# Patient Record
Sex: Female | Born: 1965 | Hispanic: No | Marital: Married | Smoking: Former smoker
Health system: Southern US, Community
[De-identification: ages and names within clinical notes are randomized; demographics above are authoritative.]

## PROBLEM LIST (undated history)

## (undated) DIAGNOSIS — E039 Hypothyroidism, unspecified: Secondary | ICD-10-CM

## (undated) DIAGNOSIS — E559 Vitamin D deficiency, unspecified: Secondary | ICD-10-CM

## (undated) DIAGNOSIS — K5790 Diverticulosis of intestine, part unspecified, without perforation or abscess without bleeding: Secondary | ICD-10-CM

## (undated) DIAGNOSIS — L409 Psoriasis, unspecified: Secondary | ICD-10-CM

## (undated) DIAGNOSIS — M1990 Unspecified osteoarthritis, unspecified site: Secondary | ICD-10-CM

## (undated) DIAGNOSIS — K8020 Calculus of gallbladder without cholecystitis without obstruction: Secondary | ICD-10-CM

## (undated) DIAGNOSIS — T7840XA Allergy, unspecified, initial encounter: Secondary | ICD-10-CM

## (undated) HISTORY — DX: Calculus of gallbladder without cholecystitis without obstruction: K80.20

## (undated) HISTORY — DX: Diverticulosis of intestine, part unspecified, without perforation or abscess without bleeding: K57.90

## (undated) HISTORY — DX: Allergy, unspecified, initial encounter: T78.40XA

## (undated) HISTORY — DX: Vitamin D deficiency, unspecified: E55.9

## (undated) HISTORY — DX: Unspecified osteoarthritis, unspecified site: M19.90

## (undated) HISTORY — DX: Psoriasis, unspecified: L40.9

## (undated) HISTORY — PX: TONSILLECTOMY: SUR1361

## (undated) HISTORY — DX: Hypothyroidism, unspecified: E03.9

## (undated) HISTORY — PX: TONSILECTOMY, ADENOIDECTOMY, BILATERAL MYRINGOTOMY AND TUBES: SHX2538

---

## 2011-12-13 ENCOUNTER — Ambulatory Visit (INDEPENDENT_AMBULATORY_CARE_PROVIDER_SITE_OTHER): Payer: Managed Care, Other (non HMO) | Admitting: Family Medicine

## 2011-12-13 ENCOUNTER — Encounter: Payer: Self-pay | Admitting: Family Medicine

## 2011-12-13 VITALS — BP 126/82 | HR 81 | Temp 98.3°F | Resp 16 | Ht 66.5 in | Wt 133.8 lb

## 2011-12-13 DIAGNOSIS — L409 Psoriasis, unspecified: Secondary | ICD-10-CM | POA: Insufficient documentation

## 2011-12-13 DIAGNOSIS — E039 Hypothyroidism, unspecified: Secondary | ICD-10-CM | POA: Insufficient documentation

## 2011-12-13 DIAGNOSIS — R1013 Epigastric pain: Secondary | ICD-10-CM

## 2011-12-13 DIAGNOSIS — K3189 Other diseases of stomach and duodenum: Secondary | ICD-10-CM

## 2011-12-13 NOTE — Progress Notes (Signed)
  Subjective:    Patient ID: Tasha House, female    DOB: Dec 11, 1965, 46 y.o.   MRN: [id number]  HPI This 46 y.o. Female is having heartburn and variable stools for several months. She tried Prilosec  for 2 weeks as recommended by ENT- Dr. Lazarus Salines. She continues to have Gi symptoms including  heartburn and abnormal stools,usually loose and malodorous. Pt denies hematochezia or melena.    Review of Systems  Constitutional: Positive for appetite change.  HENT: Positive for congestion, sore throat, trouble swallowing, postnasal drip and sinus pressure.   Eyes: Negative.   Respiratory: Positive for choking.   Cardiovascular: Negative.   Gastrointestinal:       Heartburn and reflux  Genitourinary: Negative.   Musculoskeletal: Negative.   Neurological: Negative.        Objective:   Physical Exam  Vitals reviewed. Constitutional: She is oriented to person, place, and time. She appears well-developed and well-nourished. No distress.  HENT:  Head: Normocephalic and atraumatic.  Right Ear: External ear normal.  Left Ear: External ear normal.  Mouth/Throat: No oropharyngeal exudate.       Mild erythema at posterior pharynx  Eyes: Conjunctivae and EOM are normal. No scleral icterus.  Neck: Normal range of motion. No thyromegaly present.  Cardiovascular: Normal rate and regular rhythm.   Pulmonary/Chest: Effort normal and breath sounds normal. No respiratory distress.  Abdominal: There is tenderness.       Epigastric tenderness with moderate palpation.  Lymphadenopathy:    She has no cervical adenopathy.  Neurological: She is alert and oriented to person, place, and time. No cranial nerve deficit.  Skin: Skin is warm.          Assessment & Plan:   1. Hypothyroid  Continue current medication  2. Psoriasis    3. Dyspepsia  Helicobacter pylori abs-IgG+IgA, bld

## 2011-12-13 NOTE — Patient Instructions (Signed)
Helicobacter Pylori and Ulcer Disease An ulcer may be in your stomach (gastric ulcer) or in the first part of your small bowel, which is called the duodenum (duodenal ulcer). An ulcer is a break in the stomach or duodenum lining. The break wears down into the deeper tissue. Helicobacter pylori (H. pylori) is a type of germ (bacteria) that may cause the majority of gastric or duodenal ulcers. CAUSES   A germ (bacterium). H. pylori can weaken the protective mucous coating of the stomach and duodenum. This allows acid to get through to the sensitive lining of the stomach or duodenum and an ulcer can then form.   Certain medications.   Using substances that can bother the lining of the stomach (alcohol, tobacco or medications such as Advil or Motrin) in the presence of H.pylori infection. This can increase the chances of getting an ulcer.   Cancer (rarely).  Most people infected with H. pylori do not get ulcers. It is not known how people catch H. pylori. It may be through food or water. H. pylori has been found in the saliva of some infected people. Therefore, the bacteria may also spread through mouth-to-mouth contact such as kissing. SYMPTOMS  The problems (symptoms) of ulcer disease are usually:  A burning or gnawing of the mid-upper belly (abdomen). This is often worse on an empty stomach. It may get better with food. This may be associated with feeling sick to your stomach (nausea), bloating and vomiting.   If the ulcer results in bleeding, it can cause:   Black, tarry stools.   Throwing up bright red blood.   Throwing up coffee ground looking materials.  With severe bleeding, there may be loss of consciousness and shock. Besides ulcer disease, H. pylori can also cause chronic gastritis (irritation of the lining of the stomach without ulcer) or stomach acid-type discomfort. You may not have symptoms even though you have an H. pylori infection. Although this is an infection, you may not  have usual infection symptoms (such as fever). DIAGNOSIS  Ulcer disease can be diagnosed in many different ways. If you have an ulcer, it is important to know whether or not it is caused by H. Pylori. Treatment for an ulcer caused by H. pylori is different from that for an ulcer with other causes. The best way to detect H. pylori is taking tissue directly from the ulcer during an endoscopy test.   An endoscopy is an exam that uses an endoscope. This is a thin, lighted tube with a small camera on the end. It is like a flexible telescope. The patient is given a drug to make them calm (sedative). The caregiver eases the endoscope into the mouth and down the throat to the stomach and duodenum. This allows the doctor to see the lining of the esophagus, stomach and duodenum.   If an endoscopy is not needed, then H. pylori can be detected with tests of the blood, stool or even breath.  TREATMENT   H. pylori peptic ulcer treatment usually involves a combination of:   Medicines that kill germs (antibiotics).   Acid suppressors.   Stomach protectors.   The use of only one medication to treat H. pylori is not recommended. The most proven treatment is a 2 week course of treatment called triple therapy. It involves taking two antibiotics to kill the bacteria and either an acid suppressor or stomach-lining shield. Two-week triple therapy reduces ulcer symptoms, kills the bacteria, and prevents ulcers from coming back in many   patients.   Unfortunately, patients may find triple therapy hard to do. This is because it involves taking as many as 20 pills a day. Also, the antibiotics used in triple therapy may cause mild side effects. These include nausea, vomiting, diarrhea, dark stools, a metallic taste in the mouth, dizziness, headache and yeast infections in women. Talk to your caregiver if you have any of these side effects.  HOME CARE INSTRUCTIONS   Take your medications as directed and for as long as  prescribed. Contact your caregiver if you have problems or side effects from your medications.   Continue regular work and usual activities unless told otherwise by your caregiver.   Avoid tobacco, alcohol and caffeine. Tobacco use will decrease and slow healing.   Avoid medications that are harmful. This includes aspirin and NSAIDS such as ibuprofen and naproxen.   Avoid foods that seem to aggravate or cause discomfort.   There are many over-the-counter products available to control stomach acid and other symptoms. Discuss these with your caregiver before using them. Do not  stop taking prescription medications for over-the-counter medications without talking with your caregiver.   Special diets are not usually needed.   Keep any follow-up appointments and blood tests as directed.  SEEK MEDICAL CARE IF:   Your pain or other ulcer symptoms do not improve within a few days of starting treatment.   You develop diarrhea. This can be a problem related to certain treatments.   You have ongoing indigestion or heartburn even if your main ulcer symptoms are improved.   You think you have any side effects from your medications or if you do not understand how to use your medications right.  SEEK IMMEDIATE MEDICAL CARE IF:  Any of the following happen:  You develop bright red, rectal bleeding.   You develop dark black, tarry stools.   You throw up (vomit) blood.   You become light-headed, weak, have fainting episodes, or become sweaty, cold and clammy.   You have severe abdominal pain not controlled by medications. Do not take pain medications unless ordered by your caregiver.  MAKE SURE YOU:   Understand these instructions.   Will watch your condition.   Will get help right away if you are not doing well or get worse.  Document Released: 11/25/2003 Document Revised: 08/24/2011 Document Reviewed: 04/23/2008 [employer] Patient Information 2012 [employer]. 

## 2011-12-21 ENCOUNTER — Encounter: Payer: Self-pay | Admitting: Family Medicine

## 2011-12-21 DIAGNOSIS — R1013 Epigastric pain: Secondary | ICD-10-CM | POA: Insufficient documentation

## 2011-12-26 MED ORDER — OMEPRAZOLE 20 MG PO CPDR: DELAYED_RELEASE_CAPSULE | ORAL | Status: DC

## 2011-12-26 MED ORDER — BIS SUBCIT-METRONID-TETRACYC 140-125-125 MG PO CAPS: 3.0000 | ORAL_CAPSULE | Freq: Three times a day (TID) | ORAL | Status: DC

## 2011-12-26 NOTE — Progress Notes (Signed)
The pt has a significant H. Pylori infection; Pylera is prescribed because she has a PCN allergy. The Pylera medication should be taken 4x daily and Omeprazole is 2x daily for 10 days then she will continue Omeprazole daily for 10 -12 weeks.

## 2011-12-26 NOTE — Progress Notes (Signed)
Quick Note:  Please call pt and advise that the following labs are abnormal... Blood test for H. Pylori antibody is very high and is a positive result.  Following medications have been e-prescribed: Pylera (antibiotics to get rid of H. Pylori) and Omeprazole to reduce acid.  Pt needs to get started on these and take per directions. If she is unclear, please consult pharmacist at the time she picks up medication.  Provide copy of labs to pt. ______

## 2011-12-26 NOTE — Progress Notes (Signed)
Addended by: Dow Adolph B on: 12/26/2011 06:02 PM   Modules accepted: Orders

## 2011-12-27 ENCOUNTER — Telehealth: Payer: Self-pay

## 2011-12-27 NOTE — Telephone Encounter (Signed)
Pt is calling back in regards to questions about medication prescribed on 12/13/11. She is about to begin taking it an dis unsure of instructions

## 2011-12-27 NOTE — Telephone Encounter (Signed)
Patient would like a call back about the rx that Dr. Audria Nine prescribed.

## 2011-12-27 NOTE — Telephone Encounter (Signed)
LMOM TO CB 

## 2011-12-28 NOTE — Telephone Encounter (Signed)
Pt returning clinical phone call please contact her @ [phone]

## 2011-12-31 NOTE — Telephone Encounter (Signed)
LMOM TO CB 

## 2012-01-03 NOTE — Telephone Encounter (Signed)
LMOM TO CB 

## 2012-01-04 NOTE — Telephone Encounter (Signed)
Pt CB and stated that she has a couple of days of her Abx left but has just recently in last 1-2 days started having more Sxs again. Reports that her heartburn is worse and she has a poor appetite and has to go immediately to the bathroom for BM right after eating, but it is not diarrhea. She is taking omeprazole 40 BID that she already had - she did not p/up your Rx since she already had some. Pt is wondering if these could be SEs of the Abx this late in the course, or if she needs something else instead of omeprazole? Please advise. Pt asks for CB before 2 pm at home number so that we can reach her.

## 2012-01-05 NOTE — Telephone Encounter (Signed)
Gave pt message from Dr Audria Nine and explained probiotics to pt and D/W ?s about labs. Mailed copy of labs

## 2012-01-05 NOTE — Telephone Encounter (Signed)
It probably is the antibiotics (alot of medication to take 4x a day!); Continue Omeprazole and try Activia yogurt 1-2 x a day.

## 2012-01-23 ENCOUNTER — Other Ambulatory Visit: Payer: Self-pay | Admitting: Gastroenterology

## 2012-01-25 ENCOUNTER — Ambulatory Visit
Admission: RE | Admit: 2012-01-25 | Discharge: 2012-01-25 | Disposition: A | Discharge status: Home / Self Care | Payer: Managed Care, Other (non HMO) | Source: Ambulatory Visit | Attending: Gastroenterology | Admitting: Gastroenterology

## 2012-01-25 MED ORDER — IOHEXOL 300 MG/ML  SOLN
100.0000 mL | Freq: Once | INTRAMUSCULAR | Status: AC | PRN
  Administered 2012-01-25: 100 mL via INTRAVENOUS

## 2012-09-23 ENCOUNTER — Encounter: Payer: Self-pay | Admitting: Critical Care Medicine

## 2012-09-24 ENCOUNTER — Ambulatory Visit (INDEPENDENT_AMBULATORY_CARE_PROVIDER_SITE_OTHER): Payer: Managed Care, Other (non HMO) | Admitting: Critical Care Medicine

## 2012-09-24 ENCOUNTER — Encounter: Payer: Self-pay | Admitting: Critical Care Medicine

## 2012-09-24 VITALS — BP 116/74 | HR 76 | Temp 97.7°F | Ht 68.0 in | Wt 144.2 lb

## 2012-09-24 DIAGNOSIS — R911 Solitary pulmonary nodule: Secondary | ICD-10-CM | POA: Insufficient documentation

## 2012-09-24 NOTE — Patient Instructions (Signed)
A CT Chest will be obtained , I will call results

## 2012-09-24 NOTE — Progress Notes (Signed)
Subjective:    Patient ID: Tasha House, female    DOB: 09-Mar-1966, 47 y.o.   MRN: [id number]  HPI 47 y.o. F referred for abnormal CT Scan Lung nodule on abd CT Pt had stool change and loss of appt.  Bacteria in stomach. H pylori. Then had GI eval ?CA  Hx of pancreatic CA  In father.  Dr Dulce Sellar did colonoscopy and then Ct abd done with nodule done. No real dyspnea or cough.  No weight loss.  Appt is better now.  No chest pains. Ex smoker since 2010.  Smoked 52yrs at peak 10cigs per day in high school, since children 2-3 per day.  No pneumonia hx.   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 No TB exposure.  PPD neg in high school. In 2004, sl positive PPD.  CXR unremarkable. No farming exposure. Grew up in [place].    Past Medical History  Diagnosis Date  . Allergy   . Hypothyroid   . Psoriasis   . Arthritis      Family History  Problem Relation Age of Onset  . Colon cancer Father   . Liver cancer Father   . Pancreatic cancer Father   . Glaucoma Mother   . Asthma Paternal Grandfather      History   Social History  . Marital Status: Unknown    Spouse Name: N/A    Number of Children: 2  . Years of Education: N/A   Occupational History  . Medical Secretary    Social History Main Topics  . Smoking status: Former Smoker -- 0.5 packs/day for 10 years    Types: Cigarettes    Quit date: 12/12/2008  . Smokeless tobacco: Not on file  . Alcohol Use: Yes     Comment: occ   . Drug Use: No  . Sexually Active: Not on file   Other Topics Concern  . Not on file   Social History Narrative  . No narrative on file     Allergies  Allergen Reactions  . Penicillins      Outpatient Prescriptions Prior to Visit  Medication Sig Dispense Refill  . sodium chloride (OCEAN) 0.65 % nasal spray Place 1 spray into the nose as needed.      . [DISCONTINUED] bismuth-metronidazole-tetracycline (PYLERA) 140-125-125 MG per capsule Take 3  capsules by mouth 4 (four) times daily -  before meals and at bedtime.  120 capsule  0  . [DISCONTINUED] fluticasone (FLONASE) 50 MCG/ACT nasal spray Place 2 sprays into the nose daily.      . [DISCONTINUED] levothyroxine (SYNTHROID, LEVOTHROID) 100 MCG tablet Take 100 mcg by mouth daily.      . [DISCONTINUED] naproxen sodium (ANAPROX) 220 MG tablet Take 220 mg by mouth 2 (two) times daily with a meal.      . [DISCONTINUED] omeprazole (PRILOSEC) 20 MG capsule Take 1 capsule twice a day with breakfast and dinner for 10 days then continue taking 1 capsule daily every AM.  60 capsule  1   Last reviewed on 09/24/2012 10:55 AM by Storm Frisk, MD    Review of Systems  Constitutional: Negative for fever, chills, diaphoresis, activity change, appetite change, fatigue and unexpected weight change.  HENT: Positive for rhinorrhea, postnasal drip and sinus pressure. Negative for hearing loss, ear pain, nosebleeds, congestion, sore throat, facial swelling, sneezing, mouth sores, trouble swallowing, neck pain, neck stiffness, dental problem, voice change, tinnitus and ear discharge.   Eyes:  Positive for itching. Negative for photophobia, discharge and visual disturbance.  Respiratory: Negative for apnea, cough, choking, chest tightness, shortness of breath, wheezing and stridor.   Cardiovascular: Negative for chest pain, palpitations and leg swelling.  Gastrointestinal: Negative for nausea, vomiting, abdominal pain, constipation, blood in stool and abdominal distention.  Genitourinary: Negative for dysuria, urgency, frequency, hematuria, flank pain, decreased urine volume and difficulty urinating.  Musculoskeletal: Positive for arthralgias. Negative for myalgias, back pain, joint swelling and gait problem.  Skin: Negative for color change, pallor and rash.  Neurological: Negative for dizziness, tremors, seizures, syncope, speech difficulty, weakness, light-headedness, numbness and headaches.    Hematological: Negative for adenopathy. Does not bruise/bleed easily.  Psychiatric/Behavioral: Negative for confusion, sleep disturbance and agitation. The patient is not nervous/anxious.        Objective:   Physical Exam Filed Vitals:   09/24/12 1040  BP: 116/74  Pulse: 76  Temp: 97.7 F (36.5 C)  TempSrc: Oral  Height: 5\' 8"  (1.727 m)  Weight: 65.409 kg (144 lb 3.2 oz)  SpO2: 100%    Gen: Pleasant, well-nourished, in no distress,  normal affect  ENT: No lesions,  mouth clear,  oropharynx clear, no postnasal drip  Neck: No JVD, no TMG, no carotid bruits  Lungs: No use of accessory muscles, no dullness to percussion, clear without rales or rhonchi  Cardiovascular: RRR, heart sounds normal, no murmur or gallops, no peripheral edema  Abdomen: soft and NT, no HSM,  BS normal  Musculoskeletal: No deformities, no cyanosis or clubbing  Neuro: alert, non focal  Skin: Warm, no lesions or rashes  CT Abdomen 01/2012 reviewed: 4.57mm RLL nodule      Assessment & Plan:

## 2012-09-24 NOTE — Assessment & Plan Note (Signed)
RLL nodule seen on abd CT 01/2012.  Ex smoker. 4.28mm in diameter. Doubt is meaningful Plan Repeat CT of Chest . If unchanged, reimage in 12 months and if unchanged, no further CTs

## 2012-09-27 ENCOUNTER — Inpatient Hospital Stay: Admission: RE | Admit: 2012-09-27 | Payer: Managed Care, Other (non HMO) | Source: Ambulatory Visit

## 2012-09-30 ENCOUNTER — Ambulatory Visit (INDEPENDENT_AMBULATORY_CARE_PROVIDER_SITE_OTHER)
Admission: RE | Admit: 2012-09-30 | Discharge: 2012-09-30 | Disposition: A | Discharge status: Home / Self Care | Payer: Managed Care, Other (non HMO) | Source: Ambulatory Visit | Attending: Critical Care Medicine | Admitting: Critical Care Medicine

## 2012-09-30 DIAGNOSIS — R911 Solitary pulmonary nodule: Secondary | ICD-10-CM

## 2012-11-21 ENCOUNTER — Ambulatory Visit (INDEPENDENT_AMBULATORY_CARE_PROVIDER_SITE_OTHER): Payer: Managed Care, Other (non HMO) | Admitting: Emergency Medicine

## 2012-11-21 VITALS — BP 118/80 | HR 76 | Temp 98.0°F | Resp 16 | Ht 68.0 in | Wt 143.0 lb

## 2012-11-21 DIAGNOSIS — J309 Allergic rhinitis, unspecified: Secondary | ICD-10-CM

## 2012-11-21 MED ORDER — FLUTICASONE PROPIONATE 50 MCG/ACT NA SUSP: 2.0000 | Freq: Every day | NASAL | Status: DC

## 2012-11-21 NOTE — Progress Notes (Signed)
  Subjective:    Patient ID: Tasha House, female    DOB: [birth year], 47 y.o.   MRN: [id number]  HPI pt presents with nasal pain, congestion and body aches that started a week ago. Today she has cough and itchy throat. Two years ago she saw an ENT, Dr. Lazarus Salines. She uses Flonase nasal spray each night.   She also has a Scientist, research (life sciences). Next month.   Review of Systems patient also asking about an itching she is having in her skin. She is asking whether this could be related to liver or kidney disease. I offered her the possibility of having her blood work done to check these issues and she would prefer to talk with this to her dermatologist and gynecologist prior to having blood work done to     Objective:   Physical Exam patient is alert and cooperative and not in distress. Her neck is supple. Examination of the nasal cavity reveals the turbinates to be blue and swollen. TMs are clear. The posterior pharynx is normal there are no swollen glands in the neck. Her chest is clear to both auscultation and percussion.        Assessment & Plan:  You may use Zyrtec or Claritin to help. Try it for two or three days to see if it helps. Continue Flonase return to clinic to see me for blood work after she has visited her dermatologist and gynecologist.

## 2013-03-10 ENCOUNTER — Ambulatory Visit (INDEPENDENT_AMBULATORY_CARE_PROVIDER_SITE_OTHER): Payer: Managed Care, Other (non HMO) | Admitting: Family Medicine

## 2013-03-10 VITALS — BP 107/69 | HR 70 | Temp 98.0°F | Resp 17 | Ht 68.0 in | Wt 144.0 lb

## 2013-03-10 DIAGNOSIS — L4050 Arthropathic psoriasis, unspecified: Secondary | ICD-10-CM

## 2013-03-10 DIAGNOSIS — M461 Sacroiliitis, not elsewhere classified: Secondary | ICD-10-CM

## 2013-03-10 MED ORDER — CYCLOBENZAPRINE HCL 10 MG PO TABS: 10.0000 mg | ORAL_TABLET | Freq: Two times a day (BID) | ORAL | Status: DC | PRN

## 2013-03-10 MED ORDER — METHYLPREDNISOLONE 4 MG PO TABS: ORAL_TABLET | ORAL | Status: DC

## 2013-03-10 NOTE — Patient Instructions (Signed)
Psoriasis Psoriasis is a common, long-lasting (chronic) inflammation of the skin. It affects both men and women equally, of all ages and all races. Psoriasis cannot be passed from person to person (not contagious). Psoriasis varies from mild to very severe. When severe, it can greatly affect your quality of life. Psoriasis is an inflammatory disorder affecting the skin as well as other organs including the joints (causing an arthritis). With psoriasis, the skin sheds its top layer of cells more rapidly than it does in someone without psoriasis. CAUSES  The cause of psoriasis is largely unknown. Genetics, your immune system, and the environment seem to play a role in causing psoriasis. Factors that can make psoriasis worse include:  Damage or trauma to the skin, such as cuts, scrapes, and sunburn. This damage often causes new areas of psoriasis (lesions).  Winter dryness and lack of sunlight.  Medicines such as lithium, beta-blockers, antimalarial drugs, ACE inhibitors, nonsteroidal anti-inflammatory drugs (ibuprofen, aspirin), and terbinafine. Let your caregiver know if you are taking any of these drugs.  Alcohol. Excessive alcohol use should be avoided if you have psoriasis. Drinking large amounts of alcohol can affect:  How well your psoriasis treatment works.  How safe your psoriasis treatment is.  Smoking. If you smoke, ask your caregiver for help to quit.  Stress.  Bacterial or viral infections.  Arthritis. Arthritis associated with psoriasis (psoriatic arthritis) affects less than 10% of patients with psoriasis. The arthritic intensity does not always match the skin psoriasis intensity. It is important to let your caregiver know if your joints hurt or if they are stiff. SYMPTOMS  The most common form of psoriasis begins with little red bumps that gradually become larger. The bumps begin to form scales that flake off easily. The lower layers of scales stick together. When these scales  are scratched or removed, the underlying skin is tender and bleeds easily. These areas then grow in size and may become large. Psoriasis often creates a rash that looks the same on both sides of the body (symmetrical). It often affects the elbows, knees, groin, genitals, arms, legs, scalp, and nails. Affected nails often have pitting, loosen, thicken, crumble, and are difficult to treat.  "Inverse psoriasis"occurs in the armpits, under breasts, in skin folds, and around the groin, buttocks, and genitals.  "Guttate psoriasis" generally occurs in children and young adults following a recent sore throat (strep throat). It begins with many small, red, scaly spots on the skin. It clears spontaneously in weeks or a few months without treatment. DIAGNOSIS  Psoriasis is diagnosed by physical exam. A tissue sample (biopsy) may also be taken. TREATMENT The treatment of psoriasis depends on your age, health, and living conditions.  Steroid (cortisone) creams, lotions, and ointments may be used. These treatments are associated with thinning of the skin, blood vessels that get larger (dilated), loss of skin pigmentation, and easy bruising. It is important to use these steroids as directed by your caregiver. Only treat the affected areas and not the normal, unaffected skin. People on long-term steroid treatment should wear a medical alert bracelet. Injections may be used in areas that are difficult to treat.  Scalp treatments are available as shampoos, solutions, sprays, foams, and oils. Avoid scratching the scalp and picking at the scales.  Anthralin medicine works well on areas that are difficult to treat. However, it stains clothes and skin and may cause temporary irritation.  Synthetic vitamin D (calcipotriene)can be used on small areas. It is available by prescription. The forms   of synthetic vitamin D available in health food stores do not help with psoriasis.  Coal tarsare available in various strengths  for psoriasis that is difficult to treat. They are one of the longest used treatments for difficult to treat psoriasis. However, they are messy to use.  Light therapy (UV therapy) can be carefully and professionally monitored in a dermatologist's office. Careful sunbathing is helpful for many people as directed by your caregiver. The exposure should be just long enough to cause a mild redness (erythema) of your skin. Avoid sunburn as this may make the condition worse. Sunscreen (SPF of 30 or higher) should be used to protect against sunburn. Cataracts, wrinkles, and skin aging are some of the harmful side effects of light therapy.  If creams (topical medicines) fail, there are several other options for systemic or oral medicines your caregiver can suggest. Psoriasis can sometimes be very difficult to treat. It can come and go. It is necessary to follow up with your caregiver regularly if your psoriasis is difficult to treat. Usually, with persistence you can get a good amount of relief. Maintaining consistent care is important. Do not change caregivers just because you do not see immediate results. It may take several trials to find the right combination of treatment for you. PREVENTING FLARE-UPS  Wear gloves while you wash dishes, while cleaning, and when you are outside in the cold.  If you have radiators, place a bowl of water or damp towel on the radiator. This will help put water back in the air. You can also use a humidifier to keep the air moist. Try to keep the humidity at about 60% in your home.  Apply moisturizer while your skin is still damp from bathing or showering. This traps water in the skin.  Avoid long, hot baths or showers. Keep soap use to a minimum. Soaps dry out the skin and wash away the protective oils. Use a fragrance free, dye free soap.  Drink enough water and fluids to keep your urine clear or pale yellow. Not drinking enough water depletes your skin's water  supply.  Turn off the heat at night and keep it low during the day. Cool air is less drying. SEEK MEDICAL CARE IF:  You have increasing pain in the affected areas.  You have uncontrolled bleeding in the affected areas.  You have increasing redness or warmth in the affected areas.  You start to have pain or stiffness in your joints.  You start feeling depressed about your condition.  You have a fever. Document Released: 09/01/2000 Document Revised: 11/27/2011 Document Reviewed: 02/27/2011 [facility] Patient Information 2014 [place], [place]. Sacroiliac Joint Dysfunction The sacroiliac joint connects the lower part of the spine (the sacrum) with the bones of the pelvis. CAUSES  Sometimes, there is no obvious reason for sacroiliac joint dysfunction. Other times, it may occur   During pregnancy.  After injury, such as:  Car accidents.  Sport-related injuries.  Work-related injuries.  Due to one leg being shorter than the other.  Due to other conditions that affect the joints, such as:  Rheumatoid arthritis.  Gout.  Psoriasis.  Joint infection (septic arthritis). SYMPTOMS  Symptoms may include:  Pain in the:  Lower back.  Buttocks.  Groin.  Thighs and legs.  Difficult sitting, standing, walking, lying, bending or lifting. DIAGNOSIS  A number of tests may be used to help diagnose the cause of sacroiliac joint dysfunction, including:  Imaging tests to look for other causes of pain, including:  MRI.  CT scan.  Bone scan.  Diagnostic injection: During a special x-ray (called fluoroscopy), a needle is put into the sacroiliac joint. A numbing medicine is injected into the joint. If the pain is improved or stopped, the diagnosis of sacroiliac joint dysfunction is more likely. TREATMENT  There are a number of types of treatment used for sacroiliac joint dysfunction, including:  Only take over-the-counter or prescription medicines for pain, discomfort, or fever  as directed by your caregiver.  Medications to relax muscles.  Rest. Decreasing activity can help cut down on painful muscle spasms and allow the back to heal.  Application of heat or ice to the lower back may improve muscle spasms and soothe pain.  Brace. A special back brace, called a sacroiliac belt, can help support the joint while your back is healing.  Physical therapy can help teach comfortable positions and exercises to strengthen muscles that support the sacroiliac joint.  Cortisone injections. Injections of steroid medicine into the joint can help decrease swelling and improve pain.  Hyaluronic acid injections. This chemical improves lubrication within the sacroiliac joint, thereby decreasing pain.  Radiofrequency ablation. A special needle is placed into the joint, where it burns away nerves that are carrying pain messages from the joint.  Surgery. Because pain occurs during movement of the joint, screws and plates may be installed in order to limit or prevent joint motion. HOME CARE INSTRUCTIONS   Take all medications exactly as directed.  Follow instructions regarding both rest and physical activity, to avoid worsening the pain.  Do physical therapy exercises exactly as prescribed. SEEK IMMEDIATE MEDICAL CARE IF:  You experience increasingly severe pain.  You develop new symptoms, such as numbness or tingling in your legs or feet.  You lose bladder or bowel control. Document Released: 12/01/2008 Document Revised: 11/27/2011 Document Reviewed: 12/01/2008 [facility] Patient Information 2014 [place], [place].

## 2013-03-10 NOTE — Progress Notes (Signed)
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 She's not been doing as much work in the last 2 weeks and is noted spontaneous increase in right sacroiliac area during this last week. She has psoriasis as well and notes a psoriatic rash on her anterior surfaces of her elbows and shins.  She's had this sacroiliac pain in the past and is taking muscle relaxer for it.    objective: Patient is in no acute distress Straight-leg raising is negative Palpation of the sacroiliac joint is mildly tender Is no overlying swelling or erythema in the sacroiliac region of the right side. Knee crossover is quite painful on the right but negative on the left.  Assessment: Sacroiliac pain probably secondary to psoriatic arthritis  Psoriatic arthritis - Plan: methylPREDNISolone (MEDROL) 4 MG tablet  Sacroiliac inflammation - Plan: cyclobenzaprine (FLEXERIL) 10 MG tablet  Signed, Elvina Sidle, MD

## 2013-03-16 ENCOUNTER — Telehealth: Payer: Self-pay

## 2013-03-16 NOTE — Telephone Encounter (Signed)
Pt states the medicine prescribed at last ov is not working, she is still in pain as if she had not taken anything Please call pt to advised

## 2013-03-17 ENCOUNTER — Other Ambulatory Visit: Payer: Self-pay | Admitting: Family Medicine

## 2013-03-17 DIAGNOSIS — M549 Dorsalgia, unspecified: Secondary | ICD-10-CM

## 2013-03-17 NOTE — Telephone Encounter (Signed)
I will refer to orthopedics.

## 2013-03-17 NOTE — Telephone Encounter (Signed)
Left message for her advise.

## 2013-03-17 NOTE — Telephone Encounter (Signed)
Assessment: Sacroiliac pain probably secondary to psoriatic arthritis  Psoriatic arthritis - Plan: methylPREDNISolone (MEDROL) 4 MG tablet  Sacroiliac inflammation - Plan: cyclobenzaprine (FLEXERIL) 10 MG tablet  Please advise. States no relief with the above treatment.

## 2015-11-16 ENCOUNTER — Ambulatory Visit: Payer: Managed Care, Other (non HMO) | Admitting: Podiatry

## 2015-11-30 ENCOUNTER — Ambulatory Visit: Payer: Self-pay

## 2015-11-30 ENCOUNTER — Encounter: Payer: Self-pay | Admitting: Podiatry

## 2015-11-30 ENCOUNTER — Ambulatory Visit (INDEPENDENT_AMBULATORY_CARE_PROVIDER_SITE_OTHER): Payer: Managed Care, Other (non HMO) | Admitting: Podiatry

## 2015-11-30 VITALS — BP 136/85 | HR 60 | Resp 12

## 2015-11-30 DIAGNOSIS — M2042 Other hammer toe(s) (acquired), left foot: Secondary | ICD-10-CM | POA: Diagnosis not present

## 2015-11-30 NOTE — Progress Notes (Signed)
   Subjective:    Patient ID: Tasha CairoJasminka House, female    DOB: September 17, 1966, 50 y.o.   MRN: [id number]  HPI: She presents today with a chief complaint of a hammertoe deformity second digit of the left foot. She states it has become more prevalent over the past few months. Starting to rub her shoe and causing pain with ambulation. She was referred to us by Dr. Thurston HoleWainer from orthopedics.  Review of Systems  All other systems reviewed and are negative.      Objective:   Physical Exam: I have reviewed her past medical history medications allergy surgery social history and review of systems. She presents in no acute distress. Vital signs stable alert and oriented 3. Pulses are palpable. Neurologic sensorium is intact per Semmes-Weinstein monofilament. Deep tendon reflexes are intact. Muscle strength is 5 over 5 dorsiflexion plantar flexors and inverters everters all intrinsic musculature is intact. Orthopedic evaluation demonstrates mild flexible hammertoe deformity lesser digits with exception of the second digit left foot which does appear to be contracted rigidly at the PIPJ second digit left foot. There is a soft corn overlying the PIPJ with mild erythema. It is nontender on palpation. No other open wounds or lesions are noted. Radiographs confirm hammertoe deformity second left.        Assessment & Plan:  Assessment: Hammertoe deformity second left.  Plan: Discussed etiology pathology conservative versus surgical therapies. We consented her today for hammertoe repair of the PIPJ second digit left foot. I answered all the questions to the best of my ability in layman's terms. She understood it was amenable to it and signed all 3 patient the consent form. We did discuss the possible postop complications which may include but are not limited to postop pain bleeding swelling infection loss of digit loss of limb loss of life. We dispensed a postop shoe and I will follow-up with her in the near future for  surgery.

## 2015-11-30 NOTE — Patient Instructions (Signed)
Pre-Operative Instructions  Congratulations, you have decided to take an important step to improving your quality of life.  You can be assured that the doctors of [facility] will be with you every step of the way.  1. Plan to be at the surgery center/hospital at least 1 (one) hour prior to your scheduled time unless otherwise directed by the surgical center/hospital staff.  You must have a responsible adult accompany you, remain during the surgery and drive you home.  Make sure you have directions to the surgical center/hospital and know how to get there on time. 2. For hospital based surgery you will need to obtain a history and physical form from your family physician within 1 month prior to the date of surgery- we will give you a form for you primary physician.  3. We make every effort to accommodate the date you request for surgery.  There are however, times where surgery dates or times have to be moved.  We will contact you as soon as possible if a change in schedule is required.   4. No Aspirin/Ibuprofen for one week before surgery.  If you are on aspirin, any non-steroidal anti-inflammatory medications (Mobic, Aleve, Ibuprofen) you should stop taking it 7 days prior to your surgery.  You make take Tylenol  For pain prior to surgery.  5. Medications- If you are taking daily heart and blood pressure medications, seizure, reflux, allergy, asthma, anxiety, pain or diabetes medications, make sure the surgery center/hospital is aware before the day of surgery so they may notify you which medications to take or avoid the day of surgery. 6. No food or drink after midnight the night before surgery unless directed otherwise by surgical center/hospital staff. 7. No alcoholic beverages 24 hours prior to surgery.  No smoking 24 hours prior to or 24 hours after surgery. 8. Wear loose pants or shorts- loose enough to fit over bandages, boots, and casts. 9. No slip on shoes, sneakers are best. 10. Bring  your boot with you to the surgery center/hospital.  Also bring crutches or a walker if your physician has prescribed it for you.  If you do not have this equipment, it will be provided for you after surgery. 11. If you have not been contracted by the surgery center/hospital by the day before your surgery, call to confirm the date and time of your surgery. 12. Leave-time from work may vary depending on the type of surgery you have.  Appropriate arrangements should be made prior to surgery with your employer. 13. Prescriptions will be provided immediately following surgery by your doctor.  Have these filled as soon as possible after surgery and take the medication as directed. 14. Remove nail polish on the operative foot. 15. Wash the night before surgery.  The night before surgery wash the foot and leg well with the antibacterial soap provided and water paying special attention to beneath the toenails and in between the toes.  Rinse thoroughly with water and dry well with a towel.  Perform this wash unless told not to do so by your physician.  Enclosed: 1 Ice pack (please put in freezer the night before surgery)   1 Hibiclens skin cleaner   Pre-op Instructions  If you have any questions regarding the instructions, do not hesitate to call our office.  [place]: [address] [place], [place] [address] [phone]  [place]: [address] [place], [place] [address] [phone]  [place]: [address]  [place], [place] [address] [phone]  Dr. Richard   Tuchman DPM, Dr. Norman Regal DPM Dr. Richard Sikora DPM, Dr. M. Todd Hyatt DPM, Dr. Kathryn Egerton DPM 

## 2017-02-22 LAB — HM DEXA SCAN: HM Dexa Scan: NORMAL

## 2017-10-15 LAB — HM COLONOSCOPY

## 2018-02-18 ENCOUNTER — Emergency Department (HOSPITAL_BASED_OUTPATIENT_CLINIC_OR_DEPARTMENT_OTHER): Payer: Commercial Managed Care - PPO

## 2018-02-18 ENCOUNTER — Emergency Department (HOSPITAL_BASED_OUTPATIENT_CLINIC_OR_DEPARTMENT_OTHER)
Admission: EM | Admit: 2018-02-18 | Discharge: 2018-02-18 | Disposition: A | Discharge status: Home / Self Care | Payer: Commercial Managed Care - PPO | Attending: Emergency Medicine | Admitting: Emergency Medicine

## 2018-02-18 ENCOUNTER — Encounter (HOSPITAL_BASED_OUTPATIENT_CLINIC_OR_DEPARTMENT_OTHER): Payer: Self-pay | Admitting: *Deleted

## 2018-02-18 ENCOUNTER — Other Ambulatory Visit: Payer: Self-pay

## 2018-02-18 DIAGNOSIS — K5900 Constipation, unspecified: Secondary | ICD-10-CM | POA: Insufficient documentation

## 2018-02-18 DIAGNOSIS — R1013 Epigastric pain: Secondary | ICD-10-CM | POA: Insufficient documentation

## 2018-02-18 DIAGNOSIS — E039 Hypothyroidism, unspecified: Secondary | ICD-10-CM | POA: Insufficient documentation

## 2018-02-18 DIAGNOSIS — R1011 Right upper quadrant pain: Secondary | ICD-10-CM | POA: Diagnosis not present

## 2018-02-18 DIAGNOSIS — R112 Nausea with vomiting, unspecified: Secondary | ICD-10-CM | POA: Diagnosis not present

## 2018-02-18 DIAGNOSIS — Z87891 Personal history of nicotine dependence: Secondary | ICD-10-CM | POA: Insufficient documentation

## 2018-02-18 LAB — URINALYSIS, ROUTINE W REFLEX MICROSCOPIC
BILIRUBIN URINE: NEGATIVE
Glucose, UA: NEGATIVE mg/dL
KETONES UR: 15 mg/dL — AB
Leukocytes, UA: NEGATIVE
NITRITE: NEGATIVE
Protein, ur: NEGATIVE mg/dL
Specific Gravity, Urine: 1.02 (ref 1.005–1.030)
pH: 7 (ref 5.0–8.0)

## 2018-02-18 LAB — CBC WITH DIFFERENTIAL/PLATELET
BASOS PCT: 0 %
Basophils Absolute: 0 10*3/uL (ref 0.0–0.1)
EOS ABS: 0 10*3/uL (ref 0.0–0.7)
Eosinophils Relative: 1 %
HEMATOCRIT: 39 % (ref 36.0–46.0)
Hemoglobin: 13.6 g/dL (ref 12.0–15.0)
LYMPHS ABS: 1.3 10*3/uL (ref 0.7–4.0)
Lymphocytes Relative: 15 %
MCH: 30.9 pg (ref 26.0–34.0)
MCHC: 34.9 g/dL (ref 30.0–36.0)
MCV: 88.6 fL (ref 78.0–100.0)
MONO ABS: 0.6 10*3/uL (ref 0.1–1.0)
MONOS PCT: 7 %
NEUTROS ABS: 6.6 10*3/uL (ref 1.7–7.7)
Neutrophils Relative %: 77 %
Platelets: 203 10*3/uL (ref 150–400)
RBC: 4.4 MIL/uL (ref 3.87–5.11)
RDW: 12.6 % (ref 11.5–15.5)
WBC: 8.6 10*3/uL (ref 4.0–10.5)

## 2018-02-18 LAB — COMPREHENSIVE METABOLIC PANEL
ALBUMIN: 4 g/dL (ref 3.5–5.0)
ALK PHOS: 84 U/L (ref 38–126)
ALT: 24 U/L (ref 14–54)
AST: 26 U/L (ref 15–41)
Anion gap: 9 (ref 5–15)
BUN: 10 mg/dL (ref 6–20)
CALCIUM: 9.1 mg/dL (ref 8.9–10.3)
CO2: 25 mmol/L (ref 22–32)
Chloride: 101 mmol/L (ref 101–111)
Creatinine, Ser: 0.63 mg/dL (ref 0.44–1.00)
GFR calc Af Amer: >60 mL/min (ref >60)
GFR calc non Af Amer: >60 mL/min (ref >60)
GLUCOSE: 114 mg/dL — AB (ref 65–99)
Potassium: 3.8 mmol/L (ref 3.5–5.1)
Sodium: 135 mmol/L (ref 135–145)
Total Bilirubin: 0.7 mg/dL (ref 0.3–1.2)
Total Protein: 7.2 g/dL (ref 6.5–8.1)

## 2018-02-18 LAB — LIPASE, BLOOD: Lipase: 27 U/L (ref 11–51)

## 2018-02-18 LAB — URINALYSIS, MICROSCOPIC (REFLEX)

## 2018-02-18 LAB — PREGNANCY, URINE: Preg Test, Ur: NEGATIVE

## 2018-02-18 MED ORDER — ONDANSETRON 4 MG PO TBDP: 4.0000 mg | ORAL_TABLET | Freq: Three times a day (TID) | ORAL | 0 refills | Status: DC | PRN

## 2018-02-18 MED ORDER — SODIUM CHLORIDE 0.9 % IV BOLUS
500.0000 mL | Freq: Once | INTRAVENOUS | Status: AC
  Administered 2018-02-18: 500 mL via INTRAVENOUS

## 2018-02-18 MED ORDER — ONDANSETRON 4 MG PO TBDP
4.0000 mg | ORAL_TABLET | Freq: Once | ORAL | Status: AC
  Administered 2018-02-18: 4 mg via ORAL
  Filled 2018-02-18: qty 1

## 2018-02-18 MED ORDER — TRAMADOL HCL 50 MG PO TABS: 50.0000 mg | ORAL_TABLET | Freq: Four times a day (QID) | ORAL | 0 refills | Status: AC | PRN

## 2018-02-18 MED ORDER — GI COCKTAIL ~~LOC~~
30.0000 mL | Freq: Once | ORAL | Status: AC
  Administered 2018-02-18: 30 mL via ORAL
  Filled 2018-02-18: qty 30

## 2018-02-18 MED ORDER — IOPAMIDOL (ISOVUE-300) INJECTION 61%
100.0000 mL | Freq: Once | INTRAVENOUS | Status: AC | PRN
  Administered 2018-02-18: 100 mL via INTRAVENOUS

## 2018-02-18 MED ORDER — DICYCLOMINE HCL 20 MG PO TABS: 20.0000 mg | ORAL_TABLET | Freq: Two times a day (BID) | ORAL | 0 refills | Status: DC

## 2018-02-18 MED ORDER — KETOROLAC TROMETHAMINE 30 MG/ML IJ SOLN
30.0000 mg | Freq: Once | INTRAMUSCULAR | Status: AC
  Administered 2018-02-18: 30 mg via INTRAVENOUS
  Filled 2018-02-18: qty 1

## 2018-02-18 NOTE — ED Provider Notes (Signed)
Emergency Department Provider Note   I have reviewed the triage vital signs and the nursing notes.   HISTORY  Chief Complaint Abdominal Pain   HPI Tasha House is a 52 y.o. female with PMH of psoriasis presents to the ED for evaluation of abdominal pain.  The patient states that she woke up with primarily mid epigastric and periumbilical abdominal pain.  She thought that she might be constipated to drink coffee to try and induce a bowel movement but began vomiting.  She has had burning, epigastric pain with some associated discomfort in the periumbilical region all day.  Symptoms are worsening throughout the day.  She has had additional nonbloody emesis.  She has had a bowel movement and passed some flatus with no significant improvement in symptoms.  She denies prior history of similar pain.  Her only surgical history is 2 cesarean sections without complication.  She denies any fevers or chills.  No associated diarrhea. No radiation of symptoms or modifying factors.   Past Medical History:  Diagnosis Date  . Allergy   . Arthritis   . Hypothyroid   . Psoriasis     Patient Active Problem List   Diagnosis Date Noted  . Lung nodule seen on imaging study 09/24/2012  . Dyspepsia 12/21/2011  . Hypothyroid 12/13/2011  . Psoriasis 12/13/2011    Past Surgical History:  Procedure Laterality Date  . CESAREAN SECTION    . TONSILECTOMY, ADENOIDECTOMY, BILATERAL MYRINGOTOMY AND TUBES      Current Outpatient Rx  . Order #: [id number] Class: Historical Med  . Order #: [id number] Class: Normal  . Order #: [id number] Class: Normal  . Order #: [id number] Class: Normal  . Order #: [id number] Class: Historical Med  . Order #: [id number] Class: Print  . Order #: [id number] Class: Historical Med  . Order #: [id number] Class: Historical Med  . Order #: [id number] Class: Print  . Order #: [id number] Class: Print    Allergies Penicillins  Family History  Problem Relation Age of Onset  . Colon cancer Father    . Liver cancer Father   . Pancreatic cancer Father   . Glaucoma Mother   . Asthma Paternal Grandfather     Social History Social History   Tobacco Use  . Smoking status: Former Smoker    Packs/day: 0.00    Years: 0.00    Pack years: 0.00    Last attempt to quit: 12/12/2008    Years since quitting: 9.1  Substance Use Topics  . Alcohol use: Yes    Comment: occ   . Drug use: No    Review of Systems  Constitutional: No fever/chills Eyes: No visual changes. ENT: No sore throat. Cardiovascular: Denies chest pain. Respiratory: Denies shortness of breath. Gastrointestinal: Positive epigastric and periumbilical abdominal pain. Positive nausea and vomiting.  No diarrhea. Positive mild constipation. Genitourinary: Negative for dysuria. Musculoskeletal: Negative for back pain. Skin: Negative for rash. Neurological: Negative for headaches, focal weakness or numbness.  10-point ROS otherwise negative.  ____________________________________________   PHYSICAL EXAM:  VITAL SIGNS: ED Triage Vitals  Enc Vitals Group     BP 02/18/18 1631 135/78     Pulse Rate 02/18/18 1631 68     Resp 02/18/18 1631 18     Temp 02/18/18 1631 98.3 F (36.8 C)     Temp Source 02/18/18 1631 Oral     SpO2 02/18/18 1631 100 %     Weight 02/18/18 1636 166 lb 0.1 oz (75.3 kg)     Height 02/18/18 1636 5\' 6"  (1.676 m)  Pain Score 02/18/18 1636 8   Constitutional: Alert and oriented. Well appearing and in no acute distress. Eyes: Conjunctivae are normal. Head: Atraumatic. Nose: No congestion/rhinnorhea. Mouth/Throat: Mucous membranes are moist.   Neck: No stridor.   Cardiovascular: Normal rate, regular rhythm. Good peripheral circulation. Grossly normal heart sounds.   Respiratory: Normal respiratory effort.  No retractions. Lungs CTAB. Gastrointestinal: Soft with diffuse mild tenderness without guarding or rebound. No distention.  Musculoskeletal: No lower extremity tenderness nor edema. No  gross deformities of extremities. Neurologic:  Normal speech and language. No gross focal neurologic deficits are appreciated.  Skin:  Skin is warm, dry and intact. No rash noted.  ____________________________________________   LABS (all labs ordered are listed, but only abnormal results are displayed)  Labs Reviewed  COMPREHENSIVE METABOLIC PANEL - Abnormal; Notable for the following components:      Result Value   Glucose, Bld 114 (*)    All other components within normal limits  URINALYSIS, ROUTINE W REFLEX MICROSCOPIC - Abnormal; Notable for the following components:   Hgb urine dipstick SMALL (*)    Ketones, ur 15 (*)    All other components within normal limits  URINALYSIS, MICROSCOPIC (REFLEX) - Abnormal; Notable for the following components:   Bacteria, UA FEW (*)    All other components within normal limits  LIPASE, BLOOD  CBC WITH DIFFERENTIAL/PLATELET  PREGNANCY, URINE   ____________________________________________  RADIOLOGY  Ct Abdomen Pelvis W Contrast  Result Date: 02/18/2018 CLINICAL DATA:  Acute abdominal pain EXAM: CT ABDOMEN AND PELVIS WITH CONTRAST TECHNIQUE: Multidetector CT imaging of the abdomen and pelvis was performed using the standard protocol following bolus administration of intravenous contrast. CONTRAST:  ISOVUE-300 IOPAMIDOL (ISOVUE-300) INJECTION 61% COMPARISON:  CT 01/25/2012 FINDINGS: Lower chest: Lung bases are clear. Hepatobiliary: No focal hepatic lesion. No intrahepatic biliary duct dilatation. There is a large elongated calculus which extends into the neck of the gallbladder measuring up to 24 mm. (Images 37-43 of coronal series 5). There is mild gallbladder wall thickening to 4 mm. Gallbladder is mildly distended 4 cm. Common bile duct is normal caliber. Pancreas: Pancreas is normal. No ductal dilatation. No pancreatic inflammation. Spleen: Normal spleen Adrenals/urinary tract: Adrenal glands and kidneys are normal. The ureters and bladder  normal. Stomach/Bowel: Stomach, small bowel, appendix, and cecum are normal. The colon and rectosigmoid colon are normal. Vascular/Lymphatic: Abdominal aorta is normal caliber. No periportal or retroperitoneal adenopathy. No pelvic adenopathy. Reproductive: Uterus and ovaries normal Other: No free fluid. Musculoskeletal: No aggressive osseous lesion. IMPRESSION: 1. Elongated calculus which tapers into the neck of the gallbladder coupled with mild gallbladder wall thickening is concerning for a chronic or early acute cholecystitis. Consider nuclear medicine HIDA scan for further evaluation if clinical presentation suggests acute cholecystitis. 2. Common bile duct normal caliber. No intrahepatic duct dilatation. 3. Gallbladder and pancreas normal. Electronically Signed   By: Genevive Bi M.D.   On: 02/18/2018 21:02    ____________________________________________   PROCEDURES  Procedure(s) performed:   Procedures  None ____________________________________________   INITIAL IMPRESSION / ASSESSMENT AND PLAN / ED COURSE  Pertinent labs & imaging results that were available during my care of the patient were reviewed by me and considered in my medical decision making (see chart for details).  Patient presents to the emergency department for evaluation of abdominal pain which is burning in quality with some associated discomfort in the periumbilical abdominal region.  She has diffuse mild tenderness.  Labs from triage reviewed with no acute findings.  Very low suspicion for acute appendicitis or gallbladder disease.  Patient has no focal right upper quadrant tenderness.  Plan for GI cocktail, ODT Zofran, and reassess.   07:18 PM Patient is not feeling any better after GI cocktail and Zofran.  Abdominal exam is unchanged.  Given her tenderness plan for CT imaging of the abdomen and pelvis to rule out early appendicitis with periumbilical pain.  Also considering gastric or duodenal ulcer and with  diffuse tenderness would want to rule out perforation.   CT imaging reviewed and discussed with GI given HIDA recommendation. Patient described feeling much better at this time and is asking to eat. She was given PO in the ED with no worsening pain. Labs are normal. Unable to obtain US at this time but with patient feeling much improved will discharge home with plan for general surgery follow up. Discussed ED return precaution and symptoms of acute cholecystitis in detail with the patient. She will call to schedule the follow up appointment tomorrow.   At this time, I do not feel there is any life-threatening condition present. I have reviewed and discussed all results (EKG, imaging, lab, urine as appropriate), exam findings with patient. I have reviewed nursing notes and appropriate previous records.  I feel the patient is safe to be discharged home without further emergent workup. Discussed usual and customary return precautions. Patient and family (if present) verbalize understanding and are comfortable with this plan.  Patient will follow-up with their primary care provider. If they do not have a primary care provider, information for follow-up has been provided to them. All questions have been answered.  ____________________________________________  FINAL CLINICAL IMPRESSION(S) / ED DIAGNOSES  Final diagnoses:  RUQ abdominal pain     MEDICATIONS GIVEN DURING THIS VISIT:  Medications  gi cocktail (Maalox,Lidocaine,Donnatal) (30 mLs Oral Given 02/18/18 1816)  ondansetron (ZOFRAN-ODT) disintegrating tablet 4 mg (4 mg Oral Given 02/18/18 1816)  ketorolac (TORADOL) 30 MG/ML injection 30 mg (30 mg Intravenous Given 02/18/18 1922)  sodium chloride 0.9 % bolus 500 mL (0 mLs Intravenous Stopped 02/18/18 2005)  iopamidol (ISOVUE-300) 61 % injection 100 mL (100 mLs Intravenous Contrast Given 02/18/18 2040)     NEW OUTPATIENT MEDICATIONS STARTED DURING THIS VISIT:  Discharge Medication List as of 02/18/2018  10:29 PM    START taking these medications   Details  dicyclomine (BENTYL) 20 MG tablet Take 1 tablet (20 mg total) by mouth 2 (two) times daily., Starting Mon 02/18/2018, Print    ondansetron (ZOFRAN ODT) 4 MG disintegrating tablet Take 1 tablet (4 mg total) by mouth every 8 (eight) hours as needed for nausea or vomiting., Starting Mon 02/18/2018, Print    traMADol (ULTRAM) 50 MG tablet Take 1 tablet (50 mg total) by mouth every 6 (six) hours as needed., Starting Mon 02/18/2018, Print        Note:  This document was prepared using Dragon voice recognition software and may include unintentional dictation errors.  Alona Bene, MD Emergency Medicine    Emon Miggins, Arlyss Repress, MD 02/19/18 [id number]

## 2018-02-18 NOTE — ED Notes (Signed)
ED Provider at bedside. 

## 2018-02-18 NOTE — ED Triage Notes (Signed)
Abdominal pain since this morning at 5:30, drank a cup of coffee and vomited.  Patient took Dulcolax thinking that she is constipated.  Burning pain to epigastric area.

## 2018-02-18 NOTE — ED Notes (Signed)
Pt given water for PO challenge 

## 2018-02-18 NOTE — ED Notes (Signed)
Pt given water and crackers for PO challenge

## 2018-02-18 NOTE — Discharge Instructions (Signed)
You have been seen in the Emergency Department (ED) for abdominal pain.  Your evaluation shows some inflamed gallbladder. You should schedule an appointment to see the general surgeon ASAP.  Please follow up as instructed above regarding today?s emergent visit and the symptoms that are bothering you.  Return to the ED if your abdominal pain worsens or fails to improve, you develop bloody vomiting, bloody diarrhea, you are unable to tolerate fluids due to vomiting, fever greater than 101, or other symptoms that concern you.

## 2018-03-19 LAB — HM PAP SMEAR: HM Pap smear: NORMAL

## 2018-03-19 LAB — HM MAMMOGRAPHY

## 2018-05-31 ENCOUNTER — Ambulatory Visit: Payer: Self-pay | Admitting: Family Medicine

## 2018-05-31 ENCOUNTER — Encounter: Payer: Self-pay | Admitting: Family Medicine

## 2018-05-31 ENCOUNTER — Ambulatory Visit (INDEPENDENT_AMBULATORY_CARE_PROVIDER_SITE_OTHER): Payer: Commercial Managed Care - PPO | Admitting: Family Medicine

## 2018-05-31 VITALS — BP 122/80 | HR 76 | Temp 98.7°F | Resp 16 | Ht 67.0 in | Wt 165.6 lb

## 2018-05-31 DIAGNOSIS — E559 Vitamin D deficiency, unspecified: Secondary | ICD-10-CM

## 2018-05-31 DIAGNOSIS — Z111 Encounter for screening for respiratory tuberculosis: Secondary | ICD-10-CM

## 2018-05-31 DIAGNOSIS — Z7689 Persons encountering health services in other specified circumstances: Secondary | ICD-10-CM

## 2018-05-31 DIAGNOSIS — Z780 Asymptomatic menopausal state: Secondary | ICD-10-CM

## 2018-05-31 DIAGNOSIS — L409 Psoriasis, unspecified: Secondary | ICD-10-CM | POA: Diagnosis not present

## 2018-05-31 DIAGNOSIS — K8020 Calculus of gallbladder without cholecystitis without obstruction: Secondary | ICD-10-CM | POA: Insufficient documentation

## 2018-05-31 DIAGNOSIS — E039 Hypothyroidism, unspecified: Secondary | ICD-10-CM

## 2018-05-31 NOTE — Progress Notes (Signed)
   Subjective:    Patient ID: Tasha House, female    DOB: [birth year], 52 y.o.   MRN: [id number]  HPI Chief Complaint  Patient presents with  . new pt    new pt, rash/dry skin on legs- 40 + years, having issues with hip with arthitis, has dermatology in october   She is new to the practice and here with to establish care. She also has complaints of psoriasis flare for the past year.  States she was diagnosed with psoriasis 40 years ago. Has tried numerous medications including topical steroids, UV light, salt therapy etc. Has never been on systemic treatment but states this has been recommended in the past by her rheumatologist and dermatologist.  States she is now ready to start on a medication such as Humira or Otezla but cannot get in to her new dermatologist until late October. Requests labs today.   States she was diagnosed with psoriatic arthritis and has seen rheumatologist for this but last visit was 5 years ago here in town but cannot recall the office.   Denies fever, chills, dizziness, chest pain, palpitations, shortness of breath, abdominal pain, N/V/D, urinary symptoms.    Other providers: OB/GYN- [facility]  [facility]- Dr. Hinda Lenisutlaw  [facility]- Dr. Barnetta ChapelWhelan  Vitamin D def-taking a daily supplement and states vitamin D was normal at last visit with OB/GYN Hypothyroidism- Dr. Senaida OresMcCoomb her OB/GYN is treating this  Colonoscopy- January 2019. Recall in 5 years due to father having colon cancer.   Married with 2 children. Works for [employer]   Reviewed allergies, medications, past medical, surgical, family, and social history.   Review of Systems Pertinent positives and negatives in the history of present illness.     Objective:   Physical Exam BP 122/80   Pulse 76   Temp 98.7 F (37.1 C) (Oral)   Resp 16   Ht 5\' 7"  (1.702 m)   Wt 165 lb 9.6 oz (75.1 kg)   SpO2 98%   BMI 25.94 kg/m   Alert and in no distress. Pharyngeal area is normal. Neck  is supple without adenopathy or thyromegaly. Cardiac exam shows a regular sinus rhythm without murmurs or gallops. Lungs are clear to auscultation. Bilateral LE with large areas of erythematous plaques with defined margins.       Assessment & Plan:  Psoriasis - Plan: CBC with Differential/Platelet, Comprehensive metabolic panel  Vitamin D deficiency  Encounter to establish care  Screening for tuberculosis - Plan: QuantiFERON-TB Gold Plus  Hypothyroidism, unspecified type  Postmenopausal  We will check basic labs and will check QuantiFERON due to history of vaccine in her childhood while [place] up [place].  My assistant called and got her an earlier appointment later this month with dermatology specialist.  Discussed that I do not typically prescribe Biologics that she is requesting. Her OB/GYN is managing her hypothyroidism and vitamin D deficiency. We will follow-up pending labs.

## 2018-06-03 ENCOUNTER — Encounter (HOSPITAL_BASED_OUTPATIENT_CLINIC_OR_DEPARTMENT_OTHER): Payer: Self-pay | Admitting: *Deleted

## 2018-06-06 LAB — COMPREHENSIVE METABOLIC PANEL
ALK PHOS: 81 IU/L (ref 39–117)
ALT: 36 IU/L — ABNORMAL HIGH (ref 0–32)
AST: 35 IU/L (ref 0–40)
Albumin/Globulin Ratio: 1.9 (ref 1.2–2.2)
Albumin: 4.8 g/dL (ref 3.5–5.5)
BILIRUBIN TOTAL: 0.4 mg/dL (ref 0.0–1.2)
BUN/Creatinine Ratio: 12 (ref 9–23)
BUN: 10 mg/dL (ref 6–24)
CHLORIDE: 101 mmol/L (ref 96–106)
CO2: 24 mmol/L (ref 20–29)
CREATININE: 0.81 mg/dL (ref 0.57–1.00)
Calcium: 10 mg/dL (ref 8.7–10.2)
GFR calc Af Amer: 97 mL/min/{1.73_m2} (ref >59)
GFR calc non Af Amer: 84 mL/min/{1.73_m2} (ref >59)
GLUCOSE: 98 mg/dL (ref 65–99)
Globulin, Total: 2.5 g/dL (ref 1.5–4.5)
Potassium: 4.2 mmol/L (ref 3.5–5.2)
Sodium: 143 mmol/L (ref 134–144)
Total Protein: 7.3 g/dL (ref 6.0–8.5)

## 2018-06-06 LAB — CBC WITH DIFFERENTIAL/PLATELET
BASOS ABS: 0 10*3/uL (ref 0.0–0.2)
Basos: 0 %
EOS (ABSOLUTE): 0.2 10*3/uL (ref 0.0–0.4)
Eos: 3 %
Hematocrit: 40.9 % (ref 34.0–46.6)
Hemoglobin: 14 g/dL (ref 11.1–15.9)
Immature Grans (Abs): 0 10*3/uL (ref 0.0–0.1)
Immature Granulocytes: 0 %
LYMPHS ABS: 2 10*3/uL (ref 0.7–3.1)
Lymphs: 40 %
MCH: 30.4 pg (ref 26.6–33.0)
MCHC: 34.2 g/dL (ref 31.5–35.7)
MCV: 89 fL (ref 79–97)
MONOS ABS: 0.6 10*3/uL (ref 0.1–0.9)
Monocytes: 12 %
Neutrophils Absolute: 2.2 10*3/uL (ref 1.4–7.0)
Neutrophils: 45 %
PLATELETS: 221 10*3/uL (ref 150–450)
RBC: 4.6 x10E6/uL (ref 3.77–5.28)
RDW: 12.4 % (ref 12.3–15.4)
WBC: 4.9 10*3/uL (ref 3.4–10.8)

## 2018-06-06 LAB — QUANTIFERON-TB GOLD PLUS
QUANTIFERON NIL VALUE: 0.05 [IU]/mL
QuantiFERON TB1 Ag Value: 0.08 IU/mL
QuantiFERON TB2 Ag Value: 0.07 IU/mL
QuantiFERON-TB Gold Plus: NEGATIVE

## 2018-06-11 ENCOUNTER — Encounter: Payer: Self-pay | Admitting: Family Medicine

## 2018-06-13 ENCOUNTER — Telehealth: Payer: Self-pay | Admitting: Internal Medicine

## 2018-06-13 ENCOUNTER — Telehealth: Payer: Self-pay | Admitting: Family Medicine

## 2018-06-13 NOTE — Telephone Encounter (Signed)
Faxed over results to Dermatology specialist to [phone]

## 2018-06-13 NOTE — Telephone Encounter (Signed)
Received requested records from [facility]. Included are notes, labs and colonoscopy. Sending back for review.

## 2018-06-20 ENCOUNTER — Encounter: Payer: Self-pay | Admitting: Family Medicine

## 2018-06-20 DIAGNOSIS — K5790 Diverticulosis of intestine, part unspecified, without perforation or abscess without bleeding: Secondary | ICD-10-CM | POA: Insufficient documentation

## 2018-07-01 ENCOUNTER — Telehealth: Payer: Self-pay | Admitting: Family Medicine

## 2018-07-01 NOTE — Telephone Encounter (Signed)
Received requested records from [facility]. Sending back for review.

## 2018-07-03 ENCOUNTER — Encounter: Payer: Self-pay | Admitting: Family Medicine

## 2018-07-10 ENCOUNTER — Telehealth: Payer: Self-pay | Admitting: Family Medicine

## 2018-07-10 NOTE — Telephone Encounter (Signed)
Received requested records form [facility]. Include is pap, mammogram, labs and bone density. Sending back for review.

## 2018-07-15 ENCOUNTER — Encounter: Payer: Self-pay | Admitting: Family Medicine

## 2018-07-23 ENCOUNTER — Encounter: Payer: Self-pay | Admitting: Internal Medicine

## 2018-07-24 ENCOUNTER — Encounter: Payer: Self-pay | Admitting: Family Medicine

## 2018-07-29 ENCOUNTER — Ambulatory Visit: Payer: Commercial Managed Care - PPO | Admitting: Family Medicine

## 2018-07-29 ENCOUNTER — Encounter: Payer: Self-pay | Admitting: Family Medicine

## 2018-07-29 VITALS — BP 116/72 | HR 66 | Temp 98.1°F | Wt 162.2 lb

## 2018-07-29 DIAGNOSIS — E039 Hypothyroidism, unspecified: Secondary | ICD-10-CM

## 2018-07-29 DIAGNOSIS — R252 Cramp and spasm: Secondary | ICD-10-CM | POA: Diagnosis not present

## 2018-07-29 DIAGNOSIS — E559 Vitamin D deficiency, unspecified: Secondary | ICD-10-CM | POA: Diagnosis not present

## 2018-07-29 DIAGNOSIS — R740 Nonspecific elevation of levels of transaminase and lactic acid dehydrogenase [LDH]: Secondary | ICD-10-CM

## 2018-07-29 DIAGNOSIS — R7401 Elevation of levels of liver transaminase levels: Secondary | ICD-10-CM

## 2018-07-29 LAB — POCT URINALYSIS DIP (PROADVANTAGE DEVICE)
BILIRUBIN UA: NEGATIVE mg/dL
Bilirubin, UA: NEGATIVE
Glucose, UA: NEGATIVE mg/dL
Leukocytes, UA: NEGATIVE
Nitrite, UA: NEGATIVE
PH UA: 5.5 (ref 5.0–8.0)
PROTEIN UA: NEGATIVE mg/dL
RBC UA: NEGATIVE
SPECIFIC GRAVITY, URINE: 1.025
Urobilinogen, Ur: 3.5

## 2018-07-29 NOTE — Progress Notes (Signed)
   Subjective:    Patient ID: Tasha House, female    DOB: May 19, 1966, 52 y.o.   MRN: [id number]  HPI Chief Complaint  Patient presents with  . other    follow up liver enezymes   She is here to follow up on elevated liver ALT. She also has new complaint of bilateral leg cramps and would like to discuss vitamin D deficiency and have her thyroid function checked due to history of hypothyroidism.   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 Takes Aleve some days but not often. Does not drink alcohol.   Complains of one month history of leg cramps occurring during the day and night time as well. Some days.  She does yoga weekly.  Started taking a magnesium supplement to see if this helped. Would like her magnesium level checked.  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   History of gallstones. No abdominal pain, nausea, vomiting, weight loss. Normal appetite.   Vitamin D deficiency and is taking 1,000 IU of vitamin D daily. Has not had this checked.   She is now taking [place] for psoriasis. This is a new medication.   Denies fever, chills, dizziness, chest pain, palpitations, shortness of breath, urinary symptoms, LE edema.   Reviewed allergies, medications, past medical, surgical, family, and social history.   Review of Systems Pertinent positives and negatives in the history of present illness.     Objective:   Physical Exam BP 116/72 (BP Location: Left Arm, Patient Position: Sitting)   Pulse 66   Temp 98.1 F (36.7 C)   Wt 162 lb 3.2 oz (73.6 kg)   SpO2 97%   BMI 25.40 kg/m   Alert and oriented and in no distress. Pharyngeal area is normal. Neck is supple without adenopathy or thyromegaly. Cardiac exam shows a regular sinus rhythm without murmurs or gallops. Lungs are clear to auscultation. Extremities without edema, calves are symmetric, non tender. Pulses intact. Skin is warm and dry, no pallor. DTRs patellar and achilles normal and symmetric without clonus.   UA- negative        Assessment & Plan:  Elevated ALT measurement - Plan: Comprehensive metabolic panel, POCT Urinalysis DIP (Proadvantage Device)  Bilateral leg cramps - Plan: CBC with Differential/Platelet, Comprehensive metabolic panel, Iron, Magnesium, POCT Urinalysis DIP (Proadvantage Device)  Vitamin D deficiency - Plan: VITAMIN D 25 Hydroxy (Vit-D Deficiency, Fractures)  Acquired hypothyroidism - Plan: TSH, T4, free  She was seen today to follow-up on elevated ALT and she had several other concerns that we also need to address. Recheck of hepatic function.  She is taking Aleve some days and does not drink alcohol.  Recent CT abdomen showed gallstones however she is asymptomatic and does not want to pursue this at this time. Bilateral leg cramps that have been ongoing but significantly worsening over the past month.  Will check labs.  Discussed staying hydrated, warm baths and stretching for now. Vitamin D deficiency and she is taking a daily supplement.  Check vitamin D level and adjust dose as appropriate. Hypothyroidism-she is taking daily levothyroxine.  Recheck TSH and free T4. Follow-up pending labs discussed that if her liver enzymes are still elevated that we will check an acute hepatitis panel and consider ultrasound.

## 2018-07-29 NOTE — Patient Instructions (Addendum)
Try stretching before going to work and before going to bed.  Warm baths and stay hydrated.   We will call you with your lab results.    Leg Cramps Leg cramps occur when a muscle or muscles tighten and you have no control over this tightening (involuntary muscle contraction). Muscle cramps can develop in any muscle, but the most common place is in the calf muscles of the leg. Those cramps can occur during exercise or when you are at rest. Leg cramps are painful, and they may last for a few seconds to a few minutes. Cramps may return several times before they finally stop. Usually, leg cramps are not caused by a serious medical problem. In many cases, the cause is not known. Some common causes include:  Overexertion.  Overuse from repetitive motions, or doing the same thing over and over.  Remaining in a certain position for a long period of time.  Improper preparation, form, or technique while performing a sport or an activity.  Dehydration.  Injury.  Side effects of some medicines.  Abnormally low levels of the salts and ions in your blood (electrolytes), especially potassium and calcium. These levels could be low if you are taking water pills (diuretics) or if you are pregnant.  Follow these instructions at home: Watch your condition for any changes. Taking the following actions may help to lessen any discomfort that you are feeling:  Stay well-hydrated. Drink enough fluid to keep your urine clear or pale yellow.  Try massaging, stretching, and relaxing the affected muscle. Do this for several minutes at a time.  For tight or tense muscles, use a warm towel, heating pad, or hot shower water directed to the affected area.  If you are sore or have pain after a cramp, applying ice to the affected area may relieve discomfort. ? Put ice in a plastic bag. ? Place a towel between your skin and the bag. ? Leave the ice on for 20 minutes, 2-3 times per day.  Avoid strenuous exercise  for several days if you have been having frequent leg cramps.  Make sure that your diet includes the essential minerals for your muscles to work normally.  Take medicines only as directed by your health care provider.  Contact a health care provider if:  Your leg cramps get more severe or more frequent, or they do not improve over time.  Your foot becomes cold, numb, or blue. This information is not intended to replace advice given to you by your health care provider. Make sure you discuss any questions you have with your health care provider. Document Released: 10/12/2004 Document Revised: 02/10/2016 Document Reviewed: 08/12/2014 Elsevier Interactive Patient Education  [employer].

## 2018-07-30 ENCOUNTER — Telehealth: Payer: Self-pay | Admitting: Family Medicine

## 2018-07-30 LAB — COMPREHENSIVE METABOLIC PANEL
A/G RATIO: 1.8 (ref 1.2–2.2)
ALT: 21 IU/L (ref 0–32)
AST: 25 IU/L (ref 0–40)
Albumin: 4.3 g/dL (ref 3.5–5.5)
Alkaline Phosphatase: 79 IU/L (ref 39–117)
BUN/Creatinine Ratio: 11 (ref 9–23)
BUN: 9 mg/dL (ref 6–24)
Bilirubin Total: 0.2 mg/dL (ref 0.0–1.2)
CALCIUM: 9.6 mg/dL (ref 8.7–10.2)
CO2: 21 mmol/L (ref 20–29)
CREATININE: 0.81 mg/dL (ref 0.57–1.00)
Chloride: 104 mmol/L (ref 96–106)
GFR, EST AFRICAN AMERICAN: 97 mL/min/{1.73_m2} (ref >59)
GFR, EST NON AFRICAN AMERICAN: 84 mL/min/{1.73_m2} (ref >59)
Globulin, Total: 2.4 g/dL (ref 1.5–4.5)
Glucose: 94 mg/dL (ref 65–99)
POTASSIUM: 4.5 mmol/L (ref 3.5–5.2)
Sodium: 141 mmol/L (ref 134–144)
TOTAL PROTEIN: 6.7 g/dL (ref 6.0–8.5)

## 2018-07-30 LAB — MAGNESIUM: MAGNESIUM: 1.9 mg/dL (ref 1.6–2.3)

## 2018-07-30 LAB — CBC WITH DIFFERENTIAL/PLATELET
BASOS: 1 %
Basophils Absolute: 0 10*3/uL (ref 0.0–0.2)
EOS (ABSOLUTE): 0.2 10*3/uL (ref 0.0–0.4)
EOS: 4 %
HEMATOCRIT: 38.8 % (ref 34.0–46.6)
Hemoglobin: 13.2 g/dL (ref 11.1–15.9)
IMMATURE GRANS (ABS): 0 10*3/uL (ref 0.0–0.1)
IMMATURE GRANULOCYTES: 0 %
LYMPHS: 37 %
Lymphocytes Absolute: 1.5 10*3/uL (ref 0.7–3.1)
MCH: 31 pg (ref 26.6–33.0)
MCHC: 34 g/dL (ref 31.5–35.7)
MCV: 91 fL (ref 79–97)
Monocytes Absolute: 0.5 10*3/uL (ref 0.1–0.9)
Monocytes: 11 %
NEUTROS PCT: 47 %
Neutrophils Absolute: 1.9 10*3/uL (ref 1.4–7.0)
Platelets: 224 10*3/uL (ref 150–450)
RBC: 4.26 x10E6/uL (ref 3.77–5.28)
RDW: 12.2 % — ABNORMAL LOW (ref 12.3–15.4)
WBC: 4.1 10*3/uL (ref 3.4–10.8)

## 2018-07-30 LAB — VITAMIN D 25 HYDROXY (VIT D DEFICIENCY, FRACTURES): Vit D, 25-Hydroxy: 34.2 ng/mL (ref 30.0–100.0)

## 2018-07-30 LAB — IRON: IRON: 96 ug/dL (ref 27–159)

## 2018-07-30 LAB — T4, FREE: FREE T4: 1.52 ng/dL (ref 0.82–1.77)

## 2018-07-30 LAB — TSH: TSH: 0.976 u[IU]/mL (ref 0.450–4.500)

## 2018-07-30 NOTE — Telephone Encounter (Signed)
Pt was advised.

## 2018-07-30 NOTE — Telephone Encounter (Signed)
Pt called and wanted to know why you were drawing the Vit D and Thyroid again, she said these were just recently drawn.  Pt ph [phone].

## 2018-07-30 NOTE — Telephone Encounter (Signed)
As far as I could tell, her most recent thyroid and vitamin D results were from July and since she was having new symptoms of muscle cramping I thought it was reasonable to check these.

## 2018-07-31 ENCOUNTER — Encounter: Payer: Self-pay | Admitting: Family Medicine

## 2018-08-19 ENCOUNTER — Ambulatory Visit: Payer: Commercial Managed Care - PPO | Admitting: Family Medicine

## 2018-08-19 ENCOUNTER — Encounter: Payer: Self-pay | Admitting: Family Medicine

## 2018-08-19 VITALS — BP 110/70 | HR 62 | Temp 98.5°F | Resp 16 | Wt 161.2 lb

## 2018-08-19 DIAGNOSIS — R1013 Epigastric pain: Secondary | ICD-10-CM | POA: Diagnosis not present

## 2018-08-19 DIAGNOSIS — K8020 Calculus of gallbladder without cholecystitis without obstruction: Secondary | ICD-10-CM | POA: Diagnosis not present

## 2018-08-19 DIAGNOSIS — K5790 Diverticulosis of intestine, part unspecified, without perforation or abscess without bleeding: Secondary | ICD-10-CM | POA: Diagnosis not present

## 2018-08-19 NOTE — Patient Instructions (Signed)
If your symptoms worsen or if you develop any new or worrisome symptoms such as uncontrolled vomiting, fever, etc., then you should be re-evaluated.

## 2018-08-19 NOTE — Progress Notes (Signed)
Subjective:    Patient ID: Tasha CairoJasminka House, female    DOB: 1965/10/28, 52 y.o.   MRN: [id number]  HPI Chief Complaint  Patient presents with  . stomach pain    stomach pain- across the whole stomach. since saturday afternoon.- over the weekend, hurt to stand or walk   She is here with complaints of bilateral upper abdominal pain that started 2 days ago while cleaning her house. Pain felt like a spasm. It radiated around her entire upper abdomen initially and was worse with movement only. Pain resolved with rest.  States she took Tramadol and it helped with her pain but rest helps more.  Pain is epigastric today and non radiating. Denies pain currently and at rest. Pain is 70% improved with movement. Denies having pain on the car ride here.  Decreased appetite. Denies feeling bloated or having a lot of gas. No reflux symptoms however she has a history of GERD. History of diverticulosis.   Reports having a normal bowel movement. No fever, chills, N/V/D. No urinary symptoms.   History of gallstones. Pain is aggravated by eating.   IMPRESSION: 1. Elongated calculus which tapers into the neck of the gallbladder coupled with mild gallbladder wall thickening is concerning for a chronic or early acute cholecystitis. Consider nuclear medicine HIDA scan for further evaluation if clinical presentation suggests acute cholecystitis. 2. Common bile duct normal caliber. No intrahepatic duct dilatation. 3. Gallbladder and pancreas normal.   Electronically Signed   By: Genevive BiStewart  Edmunds M.D.   On: 02/18/2018 21:02     Review of Systems Pertinent positives and negatives in the history of present illness.     Objective:   Physical Exam  Constitutional: She is oriented to person, place, and time. She appears well-developed and well-nourished. No distress.  HENT:  Mouth/Throat: Oropharynx is clear and moist.  Eyes: Pupils are equal, round, and reactive to light. Conjunctivae are normal.  No scleral icterus.  Neck: Normal range of motion. Neck supple.  Cardiovascular: Normal rate, regular rhythm, normal heart sounds and intact distal pulses.  Pulmonary/Chest: Effort normal and breath sounds normal.  Abdominal: Soft. Normal appearance and bowel sounds are normal. She exhibits no distension. There is no hepatosplenomegaly. There is tenderness in the epigastric area. There is no rigidity, no rebound, no guarding, no CVA tenderness, no tenderness at McBurney's point and negative Murphy's sign.  Negative psoas or obturator.  Musculoskeletal: Normal range of motion.  Neurological: She is alert and oriented to person, place, and time. No cranial nerve deficit or sensory deficit.  Skin: Skin is warm and dry. Capillary refill takes less than 2 seconds. No pallor.  Psychiatric: She has a normal mood and affect. Her behavior is normal. Thought content normal.   BP 110/70   Pulse 62   Temp 98.5 F (36.9 C) (Oral)   Resp 16   Wt 161 lb 3.2 oz (73.1 kg)   SpO2 98%   BMI 25.25 kg/m       Assessment & Plan:  Epigastric pain - Plan: CBC with Differential/Platelet, Comprehensive metabolic panel, Lipase  Gallstones  Diverticulosis  Dyspepsia  She is pleasant appearing and not in acute distress.  Nontoxic appearing. Discussed multiple etiologies for her symptoms including musculoskeletal, cholecystitis, pancreatic, GERD.  She is at least 70% improved since yesterday and there are no red flag symptoms. GI cocktail was given in the office.  Will check labs and follow-up.  Strict precautions given that if she develops worsening abdominal pain, vomiting or  fever that she should be seen immediately.

## 2018-08-20 ENCOUNTER — Other Ambulatory Visit: Payer: Self-pay | Admitting: Internal Medicine

## 2018-08-20 DIAGNOSIS — R748 Abnormal levels of other serum enzymes: Secondary | ICD-10-CM

## 2018-08-20 LAB — COMPREHENSIVE METABOLIC PANEL
ALK PHOS: 201 IU/L — AB (ref 39–117)
ALT: 664 IU/L — AB (ref 0–32)
AST: 683 IU/L — AB (ref 0–40)
Albumin/Globulin Ratio: 1.7 (ref 1.2–2.2)
Albumin: 4.5 g/dL (ref 3.5–5.5)
BILIRUBIN TOTAL: 1.9 mg/dL — AB (ref 0.0–1.2)
BUN/Creatinine Ratio: 8 — ABNORMAL LOW (ref 9–23)
BUN: 7 mg/dL (ref 6–24)
CALCIUM: 10.1 mg/dL (ref 8.7–10.2)
CHLORIDE: 102 mmol/L (ref 96–106)
CO2: 24 mmol/L (ref 20–29)
Creatinine, Ser: 0.83 mg/dL (ref 0.57–1.00)
GFR calc Af Amer: 94 mL/min/{1.73_m2} (ref >59)
GFR calc non Af Amer: 82 mL/min/{1.73_m2} (ref >59)
Globulin, Total: 2.6 g/dL (ref 1.5–4.5)
Glucose: 107 mg/dL — ABNORMAL HIGH (ref 65–99)
Potassium: 4.4 mmol/L (ref 3.5–5.2)
Sodium: 144 mmol/L (ref 134–144)
TOTAL PROTEIN: 7.1 g/dL (ref 6.0–8.5)

## 2018-08-20 LAB — CBC WITH DIFFERENTIAL/PLATELET
BASOS: 1 %
Basophils Absolute: 0 10*3/uL (ref 0.0–0.2)
EOS (ABSOLUTE): 0.1 10*3/uL (ref 0.0–0.4)
Eos: 1 %
HEMOGLOBIN: 13.6 g/dL (ref 11.1–15.9)
Hematocrit: 38.9 % (ref 34.0–46.6)
IMMATURE GRANS (ABS): 0 10*3/uL (ref 0.0–0.1)
IMMATURE GRANULOCYTES: 0 %
LYMPHS: 20 %
Lymphocytes Absolute: 1 10*3/uL (ref 0.7–3.1)
MCH: 30.7 pg (ref 26.6–33.0)
MCHC: 35 g/dL (ref 31.5–35.7)
MCV: 88 fL (ref 79–97)
Monocytes Absolute: 0.4 10*3/uL (ref 0.1–0.9)
Monocytes: 9 %
NEUTROS PCT: 69 %
Neutrophils Absolute: 3.3 10*3/uL (ref 1.4–7.0)
Platelets: 224 10*3/uL (ref 150–450)
RBC: 4.43 x10E6/uL (ref 3.77–5.28)
RDW: 11.9 % — ABNORMAL LOW (ref 12.3–15.4)
WBC: 4.8 10*3/uL (ref 3.4–10.8)

## 2018-08-20 LAB — LIPASE: LIPASE: 162 U/L — AB (ref 14–72)

## 2018-08-21 LAB — HEPATITIS PANEL, ACUTE
HEP A IGM: NEGATIVE
HEP B C IGM: NEGATIVE
HEP B S AG: NEGATIVE

## 2018-08-21 LAB — SPECIMEN STATUS REPORT

## 2018-08-23 ENCOUNTER — Ambulatory Visit
Admission: RE | Admit: 2018-08-23 | Discharge: 2018-08-23 | Disposition: A | Discharge status: Home / Self Care | Payer: Commercial Managed Care - PPO | Source: Ambulatory Visit | Attending: Family Medicine | Admitting: Family Medicine

## 2018-08-23 DIAGNOSIS — R748 Abnormal levels of other serum enzymes: Secondary | ICD-10-CM

## 2018-08-26 ENCOUNTER — Telehealth: Payer: Self-pay | Admitting: Internal Medicine

## 2018-08-26 ENCOUNTER — Telehealth: Payer: Self-pay

## 2018-08-26 DIAGNOSIS — R748 Abnormal levels of other serum enzymes: Secondary | ICD-10-CM

## 2018-08-26 NOTE — Telephone Encounter (Signed)
Call her 

## 2018-08-26 NOTE — Telephone Encounter (Signed)
Pt called back and states she wants an order to just have blood work drawn at the [facility] on [address]. She does not understand the need to come back in for a re-examination if she is not having any belly pain.

## 2018-08-26 NOTE — Telephone Encounter (Signed)
[facility]

## 2018-08-26 NOTE — Telephone Encounter (Signed)
Called pt to advise her that she need ASAP referral to general Surgeon . Pt states she does not want to see a general surgeron as she does not want to have surgery yet. She wants to go to her GI - [facility] to determine if this is necessarily. I spoke with Dr. Susann GivensLalonde and he sayy we already have a DX that going to GI is not going to help, and they do not deal with Gallbladder and that if she does not want to go to general surgeron then she needs a follow-up here for blood work and re-examination of her stomach to see if her blood work is down. I have called and left detailed message for pt to call back to schedule    Vena AustriaFYI VICKIE

## 2018-08-27 ENCOUNTER — Encounter: Payer: Self-pay | Admitting: Internal Medicine

## 2018-08-27 ENCOUNTER — Other Ambulatory Visit: Payer: Commercial Managed Care - PPO

## 2018-08-27 LAB — COMPREHENSIVE METABOLIC PANEL
ALT: 96 IU/L — AB (ref 0–32)
AST: 25 IU/L (ref 0–40)
Albumin/Globulin Ratio: 1.8 (ref 1.2–2.2)
Albumin: 4.4 g/dL (ref 3.5–5.5)
Alkaline Phosphatase: 134 IU/L — ABNORMAL HIGH (ref 39–117)
BUN/Creatinine Ratio: 9 (ref 9–23)
BUN: 8 mg/dL (ref 6–24)
Bilirubin Total: 0.4 mg/dL (ref 0.0–1.2)
CO2: 23 mmol/L (ref 20–29)
CREATININE: 0.86 mg/dL (ref 0.57–1.00)
Calcium: 9.8 mg/dL (ref 8.7–10.2)
Chloride: 101 mmol/L (ref 96–106)
GFR calc Af Amer: 90 mL/min/{1.73_m2} (ref >59)
GFR calc non Af Amer: 78 mL/min/{1.73_m2} (ref >59)
GLUCOSE: 117 mg/dL — AB (ref 65–99)
Globulin, Total: 2.4 g/dL (ref 1.5–4.5)
Potassium: 4.5 mmol/L (ref 3.5–5.2)
Sodium: 141 mmol/L (ref 134–144)
Total Protein: 6.8 g/dL (ref 6.0–8.5)

## 2018-08-27 LAB — CBC WITH DIFFERENTIAL/PLATELET
BASOS ABS: 0 10*3/uL (ref 0.0–0.2)
Basos: 1 %
EOS (ABSOLUTE): 0.2 10*3/uL (ref 0.0–0.4)
Eos: 3 %
Hematocrit: 40.3 % (ref 34.0–46.6)
Hemoglobin: 13.3 g/dL (ref 11.1–15.9)
Immature Grans (Abs): 0 10*3/uL (ref 0.0–0.1)
Immature Granulocytes: 0 %
Lymphocytes Absolute: 2.5 10*3/uL (ref 0.7–3.1)
Lymphs: 45 %
MCH: 30.2 pg (ref 26.6–33.0)
MCHC: 33 g/dL (ref 31.5–35.7)
MCV: 92 fL (ref 79–97)
MONOCYTES: 12 %
Monocytes Absolute: 0.6 10*3/uL (ref 0.1–0.9)
NEUTROS PCT: 39 %
Neutrophils Absolute: 2.1 10*3/uL (ref 1.4–7.0)
Platelets: 243 10*3/uL (ref 150–450)
RBC: 4.4 x10E6/uL (ref 3.77–5.28)
RDW: 11.9 % — ABNORMAL LOW (ref 12.3–15.4)
WBC: 5.4 10*3/uL (ref 3.4–10.8)

## 2018-08-27 NOTE — Telephone Encounter (Signed)
Pt came in for labs 08-27-18 . [facility]

## 2018-09-26 DIAGNOSIS — R109 Unspecified abdominal pain: Secondary | ICD-10-CM | POA: Diagnosis not present

## 2018-10-16 DIAGNOSIS — K8020 Calculus of gallbladder without cholecystitis without obstruction: Secondary | ICD-10-CM | POA: Diagnosis not present

## 2018-10-16 DIAGNOSIS — R1010 Upper abdominal pain, unspecified: Secondary | ICD-10-CM | POA: Diagnosis not present

## 2018-10-16 DIAGNOSIS — R748 Abnormal levels of other serum enzymes: Secondary | ICD-10-CM | POA: Diagnosis not present

## 2018-10-16 DIAGNOSIS — R948 Abnormal results of function studies of other organs and systems: Secondary | ICD-10-CM | POA: Diagnosis not present

## 2020-01-29 ENCOUNTER — Other Ambulatory Visit: Payer: Self-pay | Admitting: Endocrinology

## 2020-01-29 DIAGNOSIS — E039 Hypothyroidism, unspecified: Secondary | ICD-10-CM

## 2020-02-10 ENCOUNTER — Ambulatory Visit
Admission: RE | Admit: 2020-02-10 | Discharge: 2020-02-10 | Disposition: A | Discharge status: Home / Self Care | Payer: Commercial Managed Care - PPO | Source: Ambulatory Visit | Attending: Endocrinology | Admitting: Endocrinology

## 2020-02-10 DIAGNOSIS — E039 Hypothyroidism, unspecified: Secondary | ICD-10-CM

## 2021-02-11 NOTE — Telephone Encounter (Signed)
 I returned patients call regarding if her new Nelma could cause her tinnitus.  I explained that the doctor is not familiar with the medication and she should contact the doctor who Is more knowledgeable.  The patient could not understand why she wasn't treated for pain, and I read over her office note with her explaining she did not have any pain at the time of visit nor did she have any ear infection.  She was very frustrated when I tried to explain all of this to her.  She asked if we would  only treat her when she has pain or an infection and I told her to call the office when she had any symptoms and she could be seen by the on call doctor.  Patient is not using the Nasonex as prescribed and I told her to use it daily.  Patient was not understanding any explanation I was giving her.  I asked her if she wanted an appointment to discuss any concerns that she has, but patient declined.      Electronically signed by: Olam DELENA Gosling, RMA 02/11/21 1301

## 2021-03-03 ENCOUNTER — Other Ambulatory Visit: Payer: Self-pay | Admitting: Family Medicine

## 2021-03-03 DIAGNOSIS — R519 Headache, unspecified: Secondary | ICD-10-CM

## 2021-03-03 DIAGNOSIS — R410 Disorientation, unspecified: Secondary | ICD-10-CM

## 2021-03-03 DIAGNOSIS — R42 Dizziness and giddiness: Secondary | ICD-10-CM

## 2021-03-03 DIAGNOSIS — H9319 Tinnitus, unspecified ear: Secondary | ICD-10-CM

## 2021-03-15 ENCOUNTER — Other Ambulatory Visit: Payer: Self-pay

## 2021-03-15 ENCOUNTER — Ambulatory Visit
Admission: RE | Admit: 2021-03-15 | Discharge: 2021-03-15 | Disposition: A | Discharge status: Home / Self Care | Payer: Commercial Managed Care - PPO | Source: Ambulatory Visit | Attending: Family Medicine | Admitting: Family Medicine

## 2021-03-15 DIAGNOSIS — R410 Disorientation, unspecified: Secondary | ICD-10-CM

## 2021-03-15 DIAGNOSIS — R42 Dizziness and giddiness: Secondary | ICD-10-CM

## 2021-03-15 DIAGNOSIS — H9319 Tinnitus, unspecified ear: Secondary | ICD-10-CM

## 2021-03-15 DIAGNOSIS — R519 Headache, unspecified: Secondary | ICD-10-CM

## 2021-07-08 IMAGING — MR MR HEAD W/O CM
14 series · 48 of 48 positions shown · non-contrast
Comparison: None.

CLINICAL DATA: Dizziness, confusion

EXAM:
MRI HEAD WITHOUT CONTRAST
TECHNIQUE: Multiplanar, multiecho pulse sequences of the brain and surrounding
structures were obtained without intravenous contrast.

[Series 2: T1 · sagittal · 5.0mm · 0.45mm/px · 1 of 23 slices shown (1 of 3)]
[im 1/23]
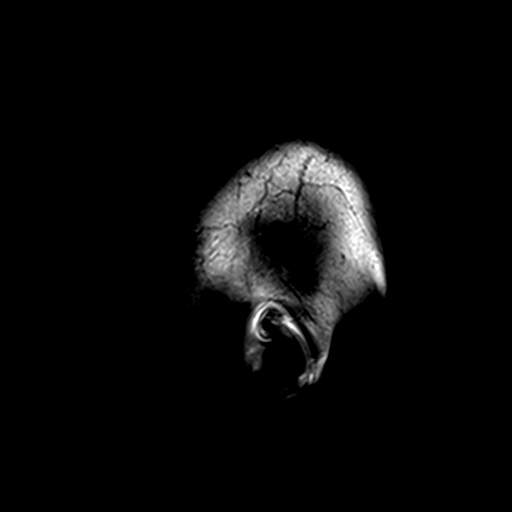

[Series 3: DWI · axial · 3.0mm · 1.80mm/px · z∈[-2,+144]mm · 7 of 100 slices shown (1 of 4)]
[im 1/100]
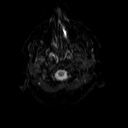
[im 17/100]
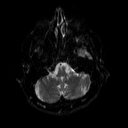
[im 34/100]
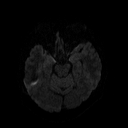
[im 50/100]
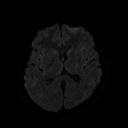
[im 67/100]
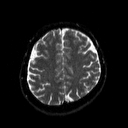
[im 83/100]
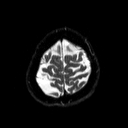
[im 100/100]
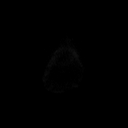

[Series 4: DWI · axial · 3.0mm · 1.80mm/px · z∈[-2,+144]mm · 4 of 47 slices shown (2 of 4)]
[im 1/47]
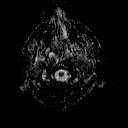
[im 16/47]
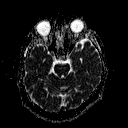
[im 31/47]
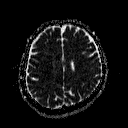
[im 47/47]
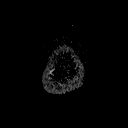

[Series 5: DWI · coronal · 5.0mm · 1.80mm/px · 5 of 71 slices shown (3 of 4)]
[im 1/71]
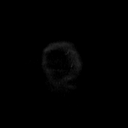
[im 18/71]
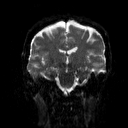
[im 36/71]
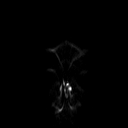
[im 53/71]
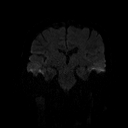
[im 71/71]
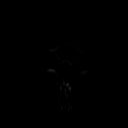

[Series 6: DWI · coronal · 5.0mm · 1.80mm/px · 3 of 36 slices shown (4 of 4)]
[im 1/36]
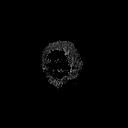
[im 18/36]
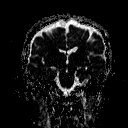
[im 36/36]
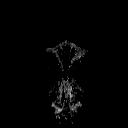

[Series 7: T2 · axial · 5.0mm · 0.60mm/px · z∈[-6,+148]mm · 2 of 23 slices shown (1 of 2)]
[im 1/23]
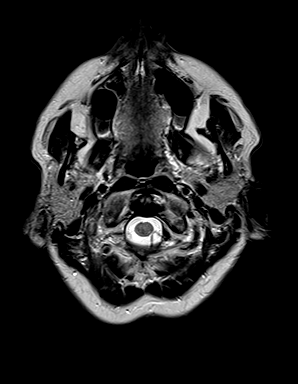
[im 23/23]
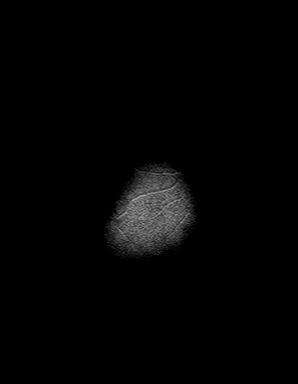

[Series 8: FLAIR · axial · 3.0mm · 0.45mm/px · z∈[-7,+146]mm · 3 of 34 slices shown]
[im 1/34]
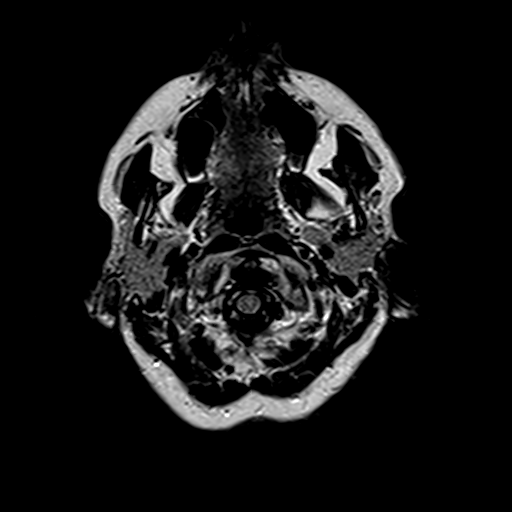
[im 17/34]
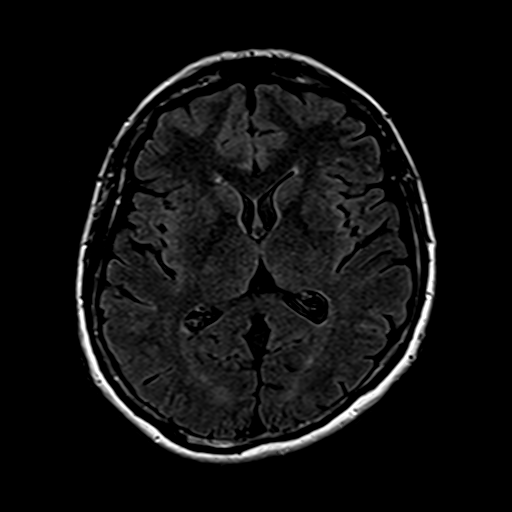
[im 34/34]
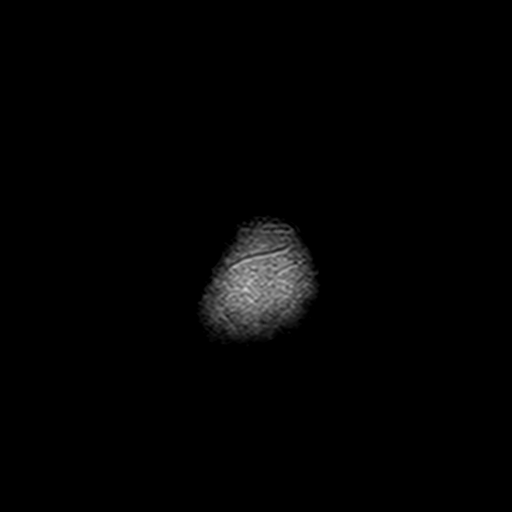

[Series 9: mip_images(sw) · axial · 32.0mm · 0.90mm/px · z∈[+6,+134]mm · 2 of 33 slices shown]
[im 1/33]
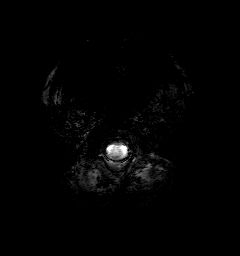
[im 33/33]
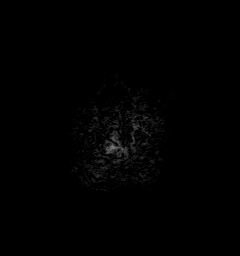

[Series 10: swi_images · axial · 4.0mm · 0.90mm/px · z∈[-8,+148]mm · 3 of 40 slices shown]
[im 1/40]
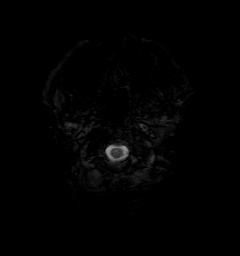
[im 20/40]
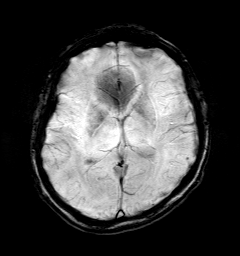
[im 40/40]
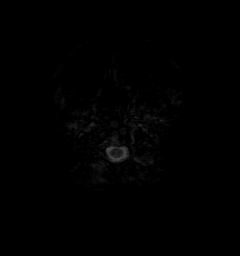

[Series 11: t1_mpr_tra · axial · 1.1mm · 0.78mm/px · z∈[-6,+149]mm · 11 of 144 slices shown]
[im 1/144]
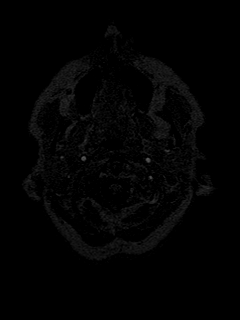
[im 15/144]
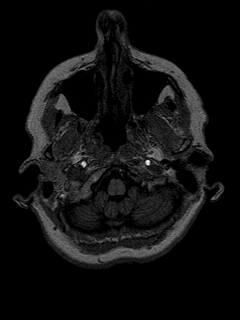
[im 29/144]
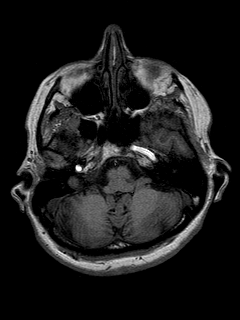
[im 43/144]
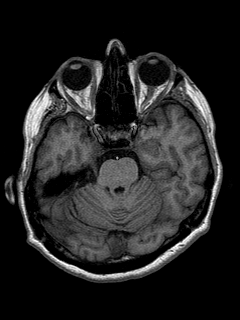
[im 58/144]
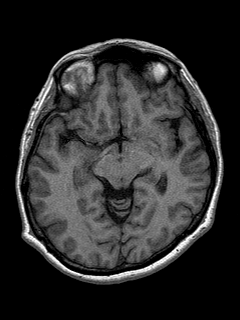
[im 72/144]
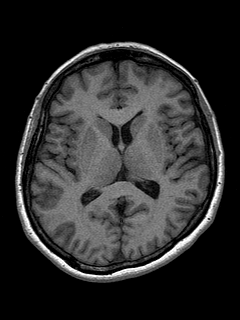
[im 86/144]
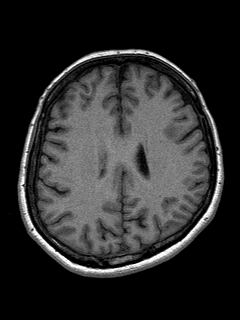
[im 101/144]
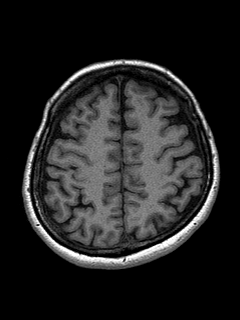
[im 115/144]
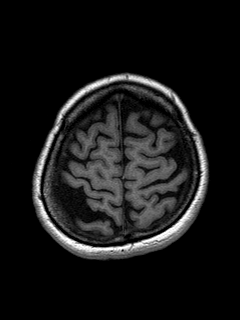
[im 129/144]
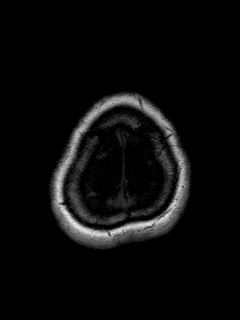
[im 144/144]
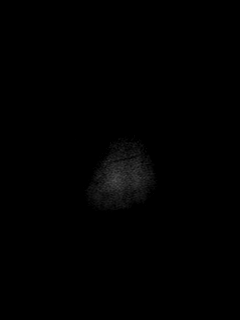

[Series 12: T2 · coronal · 5.0mm · 0.45mm/px · 2 of 27 slices shown (2 of 2)]
[im 1/27]
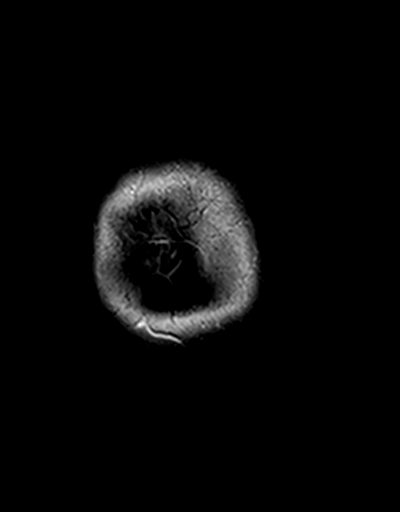
[im 27/27]
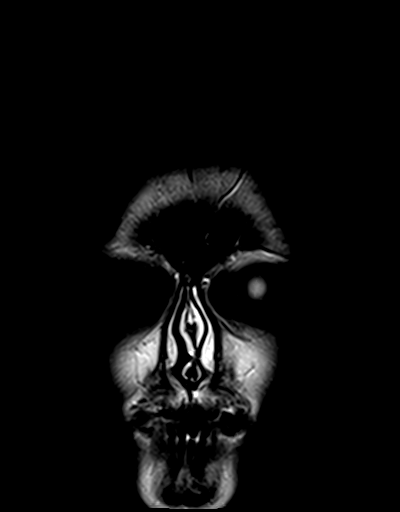

[Series 13: bSSFP · axial · 1.0mm · 0.28mm/px · z∈[+9,+44]mm · 3 of 36 slices shown]
[im 1/36]
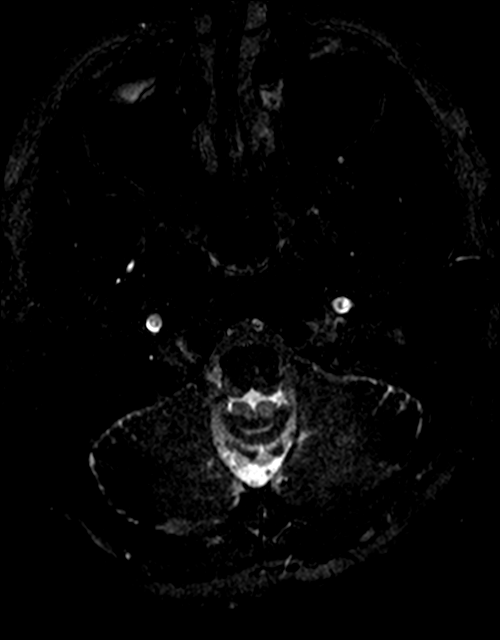
[im 18/36]
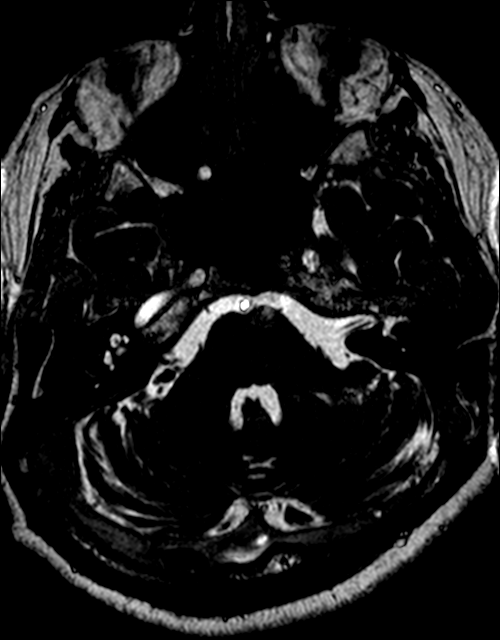
[im 36/36]
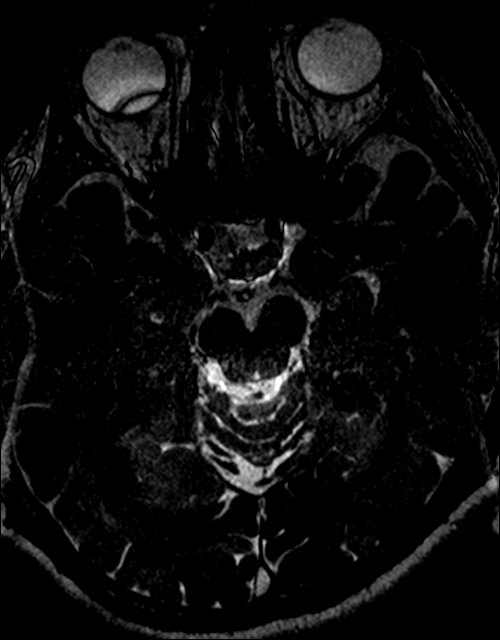

[Series 14: T1 · axial · 3.0mm · 0.35mm/px · 1 of 11 slices shown (2 of 3)]
[im 1/11]
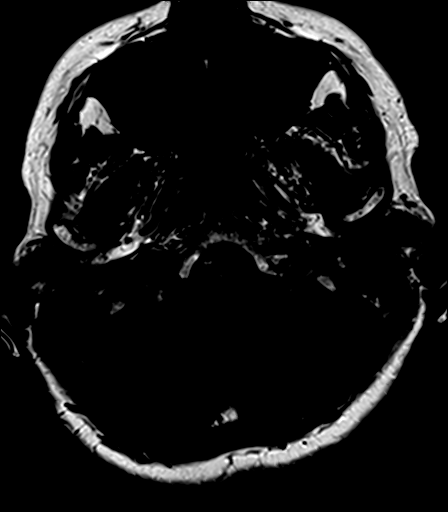

[Series 15: T1 · coronal · 3.0mm · 0.35mm/px · 1 of 11 slices shown (3 of 3)]
[im 1/11]
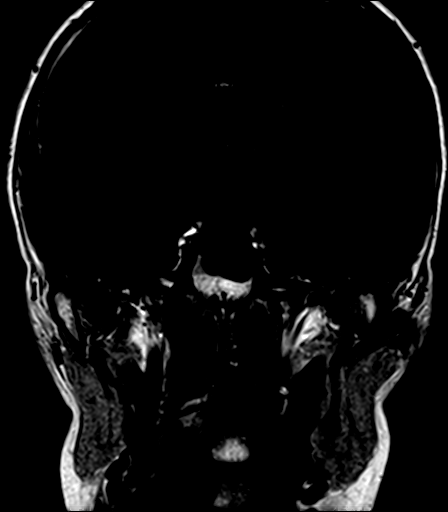

[48 of 48 positions shown; findings below may reference images not displayed]

FINDINGS: Brain: There is no acute infarction or intracranial hemorrhage.
There is no intracranial mass, mass effect, or edema. There is no
hydrocephalus or extra-axial fluid collection. Ventricles and sulci
are normal in size and configuration. Punctate focus susceptibility
in the left parietal temporal subcortical white matter is most
compatible with a chronic microhemorrhage. Minimal foci of T2
hyperintensity in the supratentorial white matter are nonspecific
but may reflect minor chronic microvascular ischemic changes.

Vascular: Major vessel flow voids at the skull base are preserved.

Skull and upper cervical spine: Normal marrow signal is preserved.

Sinuses/Orbits: Paranasal sinuses are aerated. Orbits are
unremarkable.

Other: Sella is unremarkable.  Mastoid air cells are clear.
IMPRESSION: No evidence of recent infarction, hemorrhage, or mass.

## 2024-06-26 ENCOUNTER — Encounter (INDEPENDENT_AMBULATORY_CARE_PROVIDER_SITE_OTHER): Payer: Self-pay

## 2024-06-26 ENCOUNTER — Ambulatory Visit (INDEPENDENT_AMBULATORY_CARE_PROVIDER_SITE_OTHER)

## 2024-06-26 VITALS — BP 128/85 | HR 62 | Ht 67.0 in | Wt 157.0 lb

## 2024-06-26 DIAGNOSIS — J302 Other seasonal allergic rhinitis: Secondary | ICD-10-CM

## 2024-06-26 DIAGNOSIS — J343 Hypertrophy of nasal turbinates: Secondary | ICD-10-CM

## 2024-06-26 DIAGNOSIS — R0982 Postnasal drip: Secondary | ICD-10-CM

## 2024-06-26 DIAGNOSIS — J342 Deviated nasal septum: Secondary | ICD-10-CM | POA: Diagnosis not present

## 2024-06-26 DIAGNOSIS — J3489 Other specified disorders of nose and nasal sinuses: Secondary | ICD-10-CM | POA: Diagnosis not present

## 2024-06-26 MED ORDER — AZELASTINE HCL 0.1 % NA SOLN: 2.0000 | Freq: Two times a day (BID) | NASAL | 12 refills | Status: AC

## 2024-06-26 NOTE — Progress Notes (Signed)
 Dear Dr. Kip, Here is my assessment for our mutual patient, Tasha House. Thank you for allowing me the opportunity to care for your patient. Please do not hesitate to contact me should you have any other questions. Sincerely, Dr. Hadassah Parody  Otolaryngology Clinic Note Referring provider: Dr. Kip HPI:   Initial HPI (06/26/2024) Discussed the use of AI scribe software for clinical note transcription with the patient, who gave verbal consent to proceed.  History of Present Illness Tasha House is a 58 year old female with a deviated septum who presents with chronic nasal drainage and difficulty breathing.  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 - Breathing difficulties are exacerbated when sitting and working at a computer - Breathing improves with exercise - has been told she has a deviated septum  - nasal obstruction worse on right  Allergic rhinitis and environmental sensitivities - Initial sinus infections treated with antibiotics, later attributed to allergies - Allergy testing reveals sensitivities to certain trees - February and August are particularly symptomatic months  Current symptom management - Uses mometasone nasal spray, montelukast, and Pataday eye drops for symptom control  Today she is mostly interested in addressing her nasal drainage.   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 Had sinus congestion and recommended ENT referral if not improving   PMH/Meds/All/SocHx/FamHx/ROS:   Past Medical History:  Diagnosis Date   Allergy    Arthritis    Diverticulosis    Gallstones    Hypothyroid    Psoriasis    Vitamin D  deficiency      Past Surgical History:  Procedure Laterality Date   CESAREAN SECTION     CESAREAN SECTION     x2   TONSILECTOMY, ADENOIDECTOMY, BILATERAL MYRINGOTOMY AND TUBES     TONSILLECTOMY      Family History  Problem Relation Age of Onset   Colon cancer Father    Thyroid  disease Mother    Glaucoma Mother    Liver cancer Father    Pancreatic cancer Father    Asthma Paternal Grandfather      Social Connections: Not on file     Current Outpatient Medications  Medication Instructions   azelastine (ASTELIN) 0.1 % nasal spray 2 sprays, Each Nare, 2 times daily, Use in each nostril as directed   cholecalciferol (VITAMIN D ) 1,000 Units, Daily   Cyanocobalamin (VITAMIN B12 PO) Take by mouth.   desloratadine (CLARINEX) 5 mg, Daily   levothyroxine (SYNTHROID) 112 mcg, Daily   levothyroxine (SYNTHROID) 88 mcg, Daily   montelukast (SINGULAIR) 10 mg, Daily at bedtime   Multiple Vitamin (MULTIVITAMIN) tablet 1 tablet, Daily   Omega-3 Fatty Acids (FISH OIL PO) Take by mouth. Takes approx once weekly   traMADol  (ULTRAM ) 50 mg, Oral, Every 6 hours PRN     Physical Exam:   BP 128/85   Pulse 62   Ht 5' 7 (1.702 m)   Wt 157 lb (71.2 kg)  SpO2 (!) 77%   BMI 24.59 kg/m   Salient findings:  CN II-XII intact Anterior rhinoscopy: Septum deviated to the left anteriorly but unable to visualize further back; bilateral inferior turbinates with hypertrophy  No lesions of oral cavity/oropharynx; dentition - dentures and implants  No obviously palpable neck masses/lymphadenopathy/thyromegaly No respiratory distress or stridor  Seprately Identifiable Procedures:  Prior to initiating any procedures, risks/benefits/alternatives were  explained to the patient and verbal consent obtained.  PROCEDURE (06/26/2024): Bilateral Diagnostic Rigid Nasal Endoscopy Pre-procedure diagnosis: Concern for nasal obstruction, nasal drainage Post-procedure diagnosis: same Indication: See pre-procedure diagnosis and physical exam above Complications: None apparent EBL: 0 mL Anesthesia: Lidocaine 4% and topical decongestant was topically sprayed in each nasal cavity  Description of Procedure:  Patient was identified. A rigid 30 degree endoscope was utilized to evaluate the sinonasal cavities, mucosa, sinus ostia and turbinates and septum.  Overall, signs of mucosal inflammation are not noted.  Also noted are posterior rightward nasal septal deviation with maxillary shelf, sharp leftward nasal septal deviation anteriorly.  No mucopurulence, polyps, or masses noted.   Right Middle meatus: clear Right SE Recess: clear Left MM: clear Left SE Recess: clear Photodocumentation was obtained.  CPT CODE -- 68768 - Mod 25   Impression & Plans:  Tasha House is a 58 y.o. female with   1. Nasal obstruction   2. Hypertrophy of inferior nasal turbinate   3. Nasal septal deviation   4. Rhinorrhea   5. Seasonal allergic rhinitis, unspecified trigger     Assessment and Plan Assessment & Plan Deviated nasal septum Nasal obstruction Allergic rhinitis Chronic rhinorrhea  Bilateral inferior turbinate hypertrophy  S-shaped septal deviation causing nasal obstruction. Septoplasty with turbinate reduction recommended to improve airflow. Explained risks including persistent nasal dripping. - Prescribe Astelin for nasal dripping. Continue mometasone - Schedule septoplasty with turbinate reduction. We discussed the goals of septoplasty and turbinate reduction, and expectations for postoperative management. Will plan to leave splints in place, and removal was also discussed. We also discussed nasal obstruction post-operatively until splints in place and  pain management.We discussed R/B/A including pain, infection, bleeding (~5% risk of operative visit for control), persistent symptoms, need for revision surgery, and other risks including damage to surrounding structures, septal perforation, and injury to skull base, anesthetic complications, among others.We discussed use of nasal saline spray and nasal saline irrigations post-operatively We also discussed use of intranasal steroid post-operatively until healing occurs Patient understands and is ready to proceed.   See below regarding exact medications prescribed this encounter including dosages and route: Meds ordered this encounter  Medications   azelastine (ASTELIN) 0.1 % nasal spray    Sig: Place 2 sprays into both nostrils 2 (two) times daily. Use in each nostril as directed    Dispense:  30 mL    Refill:  12    Thank you for allowing me the opportunity to care for your patient. Please do not hesitate to contact me should you have any other questions.  Sincerely, Hadassah Parody, MD Otolaryngologist (ENT), [facility] Phone: [phone] Fax: [phone]

## 2024-07-09 ENCOUNTER — Telehealth (INDEPENDENT_AMBULATORY_CARE_PROVIDER_SITE_OTHER): Payer: Self-pay

## 2024-07-09 NOTE — Telephone Encounter (Signed)
 The patient called in with some questions about the upcoming surgery. 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 Should she wait longer to go back to work for that job?  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  - She would like estimate for the surgery itself?  She has a good idea on what the surgery center charges  - With surgery, would the shape of her nose change at all?  - can surgery proceed if she still has frontal sinus pain/ headaches?

## 2024-07-09 NOTE — Telephone Encounter (Signed)
 I left a detailed message requesting the patient call back to move her post-op appointment from Dec 1st as the doctor will not be in the office that day.

## 2024-07-10 NOTE — Telephone Encounter (Signed)
 Called Pt let her know everything she understood and was thankful also gave her billings information to ask about price for anything

## 2024-07-30 ENCOUNTER — Other Ambulatory Visit: Payer: Self-pay | Admitting: Obstetrics and Gynecology

## 2024-07-30 DIAGNOSIS — Z136 Encounter for screening for cardiovascular disorders: Secondary | ICD-10-CM

## 2024-08-04 ENCOUNTER — Other Ambulatory Visit

## 2024-08-07 ENCOUNTER — Ambulatory Visit
Admission: RE | Admit: 2024-08-07 | Discharge: 2024-08-07 | Disposition: A | Source: Ambulatory Visit | Attending: Obstetrics and Gynecology | Admitting: Obstetrics and Gynecology

## 2024-08-07 DIAGNOSIS — Z136 Encounter for screening for cardiovascular disorders: Secondary | ICD-10-CM

## 2024-08-18 ENCOUNTER — Encounter (INDEPENDENT_AMBULATORY_CARE_PROVIDER_SITE_OTHER)

## 2024-08-20 ENCOUNTER — Encounter (INDEPENDENT_AMBULATORY_CARE_PROVIDER_SITE_OTHER)
